# Patient Record
Sex: Female | Born: 1935 | Race: White | Hispanic: No | Smoking: Former smoker
Health system: Southern US, Community
[De-identification: ages and names within clinical notes are randomized; demographics above are authoritative.]

## PROBLEM LIST (undated history)

## (undated) DIAGNOSIS — J189 Pneumonia, unspecified organism: Secondary | ICD-10-CM

## (undated) DIAGNOSIS — I2510 Atherosclerotic heart disease of native coronary artery without angina pectoris: Secondary | ICD-10-CM

## (undated) DIAGNOSIS — C801 Malignant (primary) neoplasm, unspecified: Secondary | ICD-10-CM

## (undated) DIAGNOSIS — I4891 Unspecified atrial fibrillation: Secondary | ICD-10-CM

## (undated) DIAGNOSIS — I5189 Other ill-defined heart diseases: Secondary | ICD-10-CM

---

## 2003-07-28 ENCOUNTER — Emergency Department (HOSPITAL_COMMUNITY): Admission: EM | Admit: 2003-07-28 | Discharge: 2003-07-28 | Payer: Self-pay | Admitting: Emergency Medicine

## 2004-10-02 ENCOUNTER — Ambulatory Visit (HOSPITAL_COMMUNITY): Admission: RE | Admit: 2004-10-02 | Discharge: 2004-10-03 | Payer: Self-pay | Admitting: Neurological Surgery

## 2009-02-28 ENCOUNTER — Ambulatory Visit: Payer: Self-pay | Admitting: Cardiology

## 2009-02-28 ENCOUNTER — Inpatient Hospital Stay (HOSPITAL_COMMUNITY): Admission: EM | Admit: 2009-02-28 | Discharge: 2009-03-09 | Payer: Self-pay | Admitting: Emergency Medicine

## 2009-03-02 ENCOUNTER — Encounter (INDEPENDENT_AMBULATORY_CARE_PROVIDER_SITE_OTHER): Payer: Self-pay | Admitting: Internal Medicine

## 2009-04-10 ENCOUNTER — Ambulatory Visit (HOSPITAL_COMMUNITY): Admission: RE | Admit: 2009-04-10 | Discharge: 2009-04-10 | Payer: Self-pay | Admitting: Pulmonary Disease

## 2009-05-15 ENCOUNTER — Encounter (INDEPENDENT_AMBULATORY_CARE_PROVIDER_SITE_OTHER): Payer: Self-pay | Admitting: *Deleted

## 2009-06-06 ENCOUNTER — Ambulatory Visit: Payer: Self-pay | Admitting: Internal Medicine

## 2009-06-06 DIAGNOSIS — K6289 Other specified diseases of anus and rectum: Secondary | ICD-10-CM

## 2009-06-06 DIAGNOSIS — K921 Melena: Secondary | ICD-10-CM

## 2009-06-06 DIAGNOSIS — R198 Other specified symptoms and signs involving the digestive system and abdomen: Secondary | ICD-10-CM

## 2009-06-07 ENCOUNTER — Encounter: Payer: Self-pay | Admitting: Internal Medicine

## 2009-06-20 ENCOUNTER — Telehealth: Payer: Self-pay | Admitting: Gastroenterology

## 2009-06-20 ENCOUNTER — Telehealth (INDEPENDENT_AMBULATORY_CARE_PROVIDER_SITE_OTHER): Payer: Self-pay

## 2009-08-09 ENCOUNTER — Ambulatory Visit: Payer: Self-pay | Admitting: Internal Medicine

## 2009-08-17 ENCOUNTER — Ambulatory Visit (HOSPITAL_COMMUNITY): Admission: RE | Admit: 2009-08-17 | Discharge: 2009-08-17 | Payer: Self-pay | Admitting: Internal Medicine

## 2009-08-17 ENCOUNTER — Ambulatory Visit: Payer: Self-pay | Admitting: Internal Medicine

## 2009-08-21 ENCOUNTER — Encounter: Payer: Self-pay | Admitting: Internal Medicine

## 2010-01-23 ENCOUNTER — Ambulatory Visit (HOSPITAL_COMMUNITY): Admission: RE | Admit: 2010-01-23 | Discharge: 2010-01-23 | Payer: Self-pay | Admitting: Pulmonary Disease

## 2010-05-10 IMAGING — CT CT ANGIO CHEST
1 of 7 series · 4 of 36 positions shown · IV contrast (Omnipaque 300)
Comparison: Chest radiographs dated 02/28/2009.

CLINICAL DATA: Chest pain.  Cough.  Fever.  Elevated D-dimer.
Pneumonia.  Smoker.

CT ANGIOGRAPHY CHEST WITH CONTRAST
TECHNIQUE: Multidetector CT imaging of the chest was performed
using the standard protocol during bolus administration of
intravenous contrast. Multiplanar CT image reconstructions
including MIPs were obtained to evaluate the vascular anatomy.
Contrast: 80 ml [id number]-[id number]

[Series 6: pe 3.0 b40f · axial · 0.54mm/px · z∈[-238,-67]mm · 4 of 96 slices shown]
[im 20/96  lung]
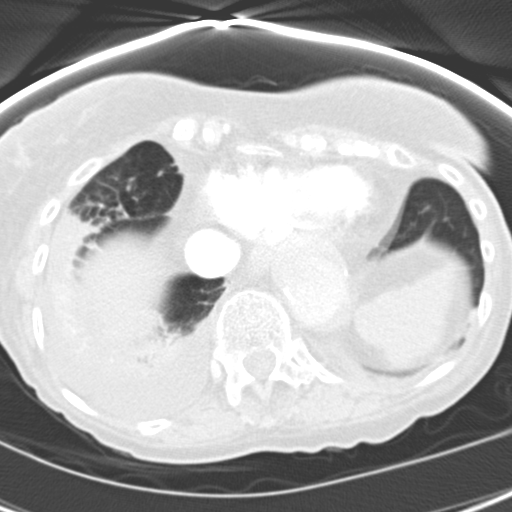
[im 39/96  mediastinal]
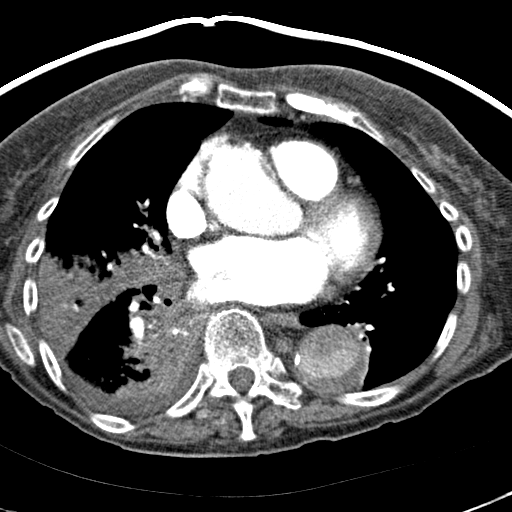
[im 58/96  lung]
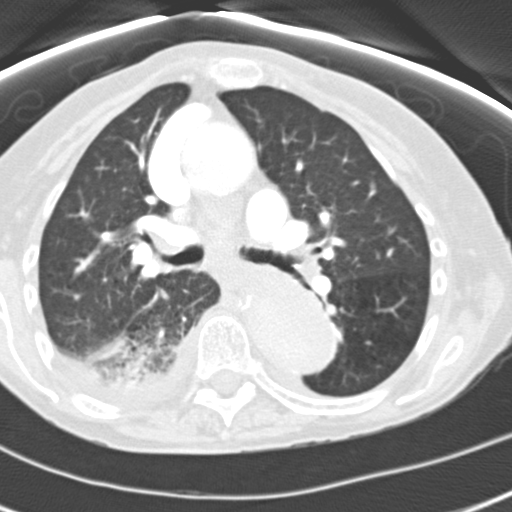
[im 77/96  mediastinal]
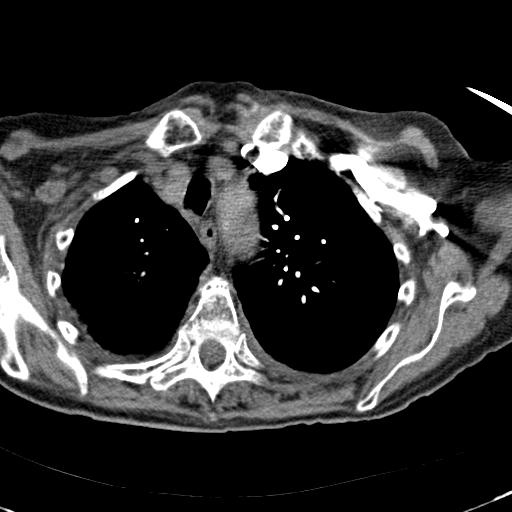

[4 of 36 positions shown; findings below may reference images not displayed]

FINDINGS: Normally opacified pulmonary arteries with no pulmonary
arterial filling defects seen.  Aneurysmal dilatation of the
ascending and descending thoracic aorta.  The ascending aorta
measures 5.2 cm in maximum diameter at the level of the main
pulmonary artery.  The proximal descending thoracic aorta measures
4.5 cm in maximum diameter and the distal descending thoracic aorta
measures 4.0 cm in maximum diameter.  Mural thrombus is also noted
in the distal descending thoracic aorta.  Patchy and dense airspace
consolidation is demonstrated in the right lower lobe and right
middle lobe.  There is also a small to moderate-sized right pleural
effusion.  Small left pleural effusion.  Mild changes of COPD and
chronic bronchitis are noted.  Fluid in the superior pericardial
recess with no lung masses or enlarged lymph nodes seen.
Unremarkable upper abdomen.  Mild thoracic spine degenerative
changes.

Review of the MIP images confirms the above findings.
IMPRESSION: 1.  No pulmonary emboli seen.
2.  Right middle lobe and right lower lobe pneumonia with an
associated small to moderate-sized right pleural effusion.
3.  Small left pleural effusion.
4.  Aneurysmal dilatation of the ascending and descending thoracic
aorta.

## 2010-05-13 IMAGING — CR DG CHEST 1V PORT
1 series · 1 of 1 positions shown · non-contrast
Comparison: CT 03/02/2009

CLINICAL DATA: Pneumonia

PORTABLE CHEST - 1 VIEW

[view not recorded]
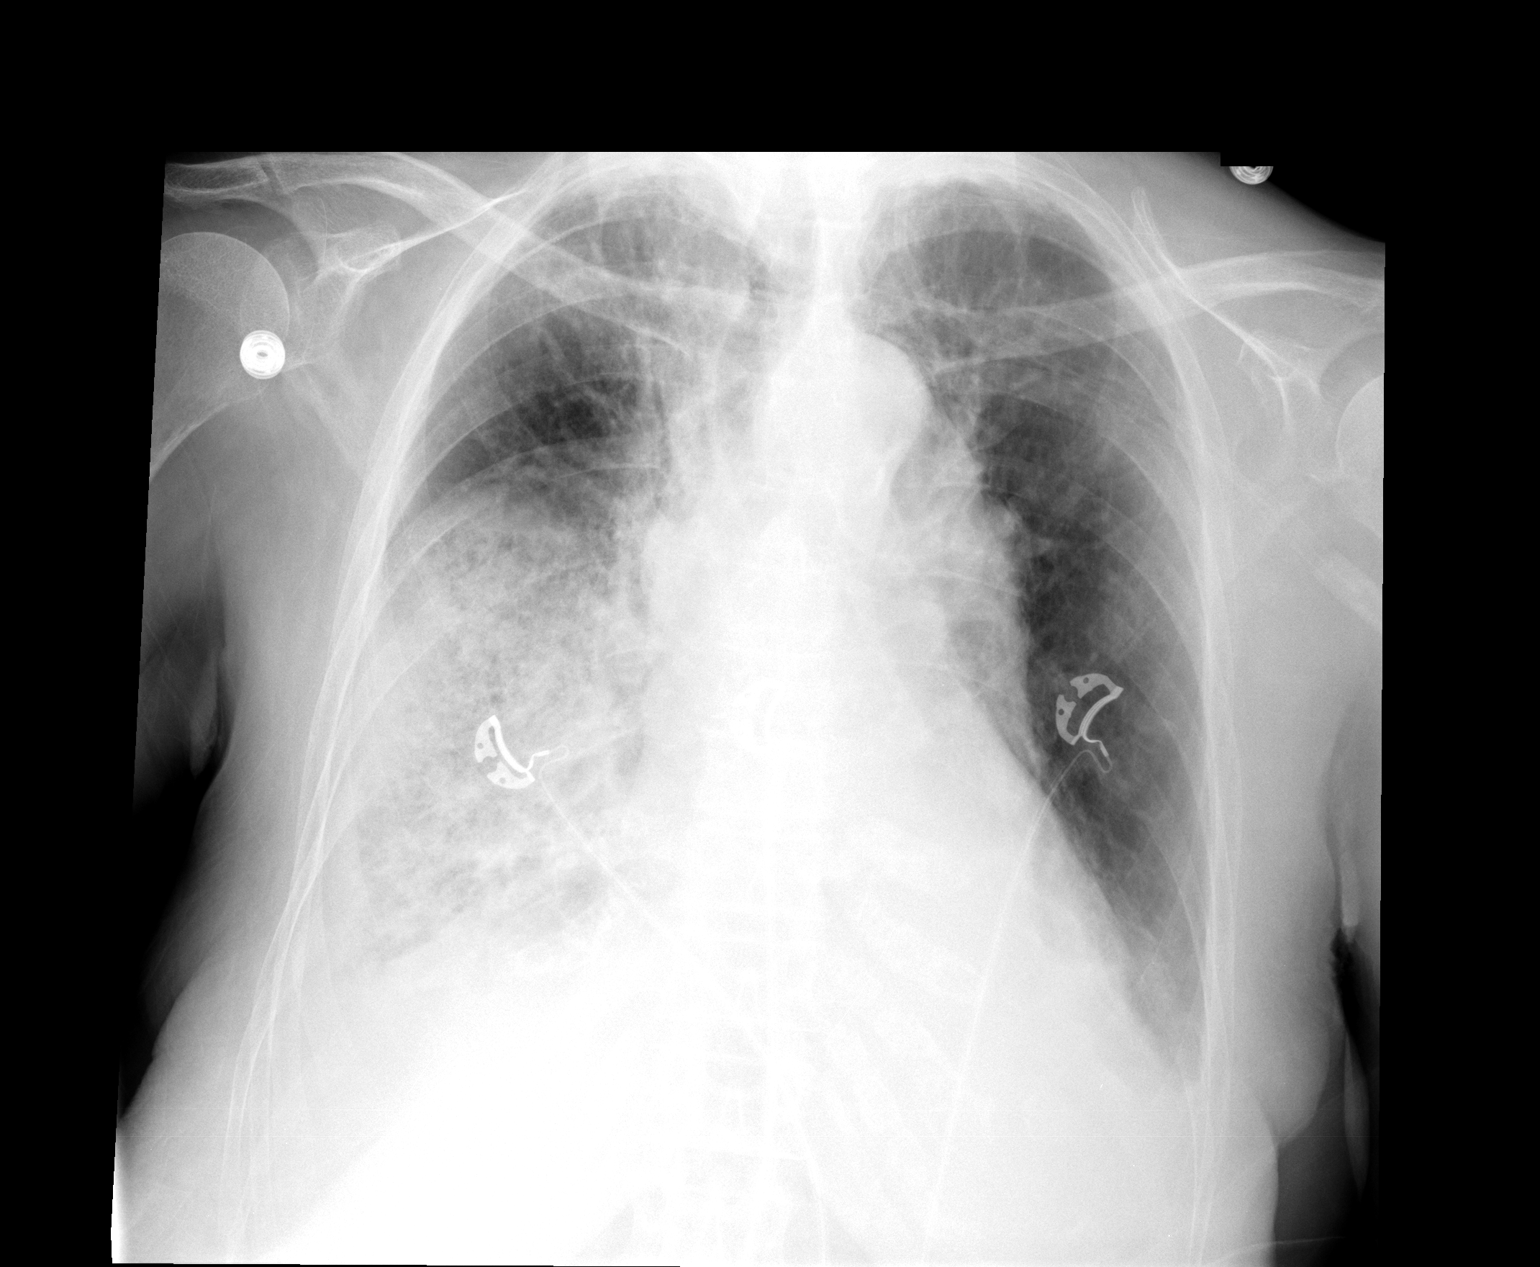

[1 of 1 positions shown; findings below may reference images not displayed]

FINDINGS: Dense airspace infiltrates in the right mid and lower
lung and in the left retrocardiac region.  Probable small pleural
effusions.  Tortuous ectatic thoracic aorta.  Heart size upper
limits normal.
IMPRESSION: 1.  Persistent asymmetric bibasilar airspace infiltrates, right
worse than left.

## 2010-05-14 IMAGING — US US ABDOMEN COMPLETE
1 series · 14 of 25 positions shown · non-contrast
Comparison: None

CLINICAL DATA: Right upper quadrant abdominal pain.  Chest pain.

COMPLETE ABDOMINAL ULTRASOUND

[Series 1: us abdomen complete · 0.20mm/px · 14 of 64 slices shown]
[im 1/64]
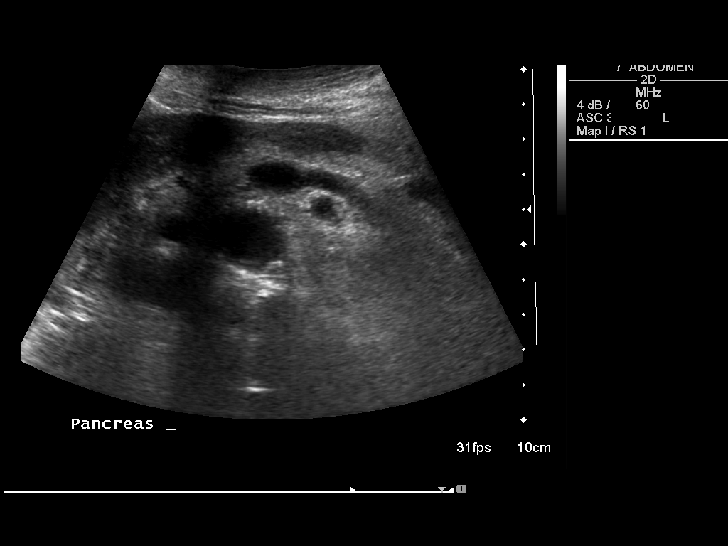
[im 6/64]
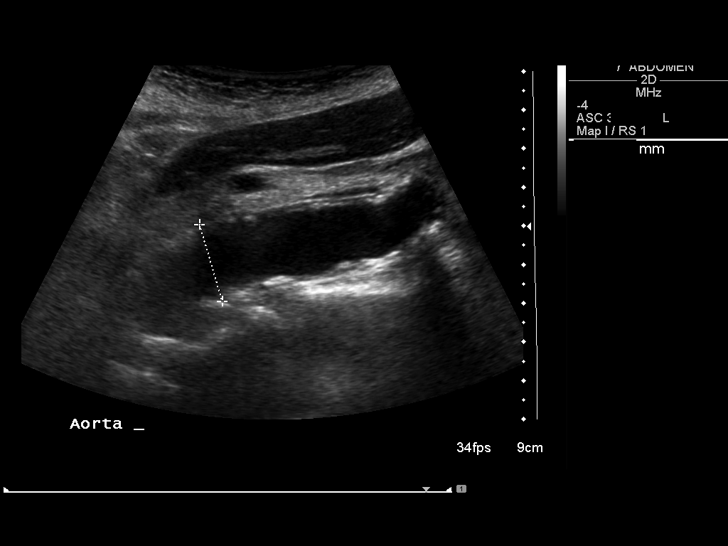
[im 11/64]
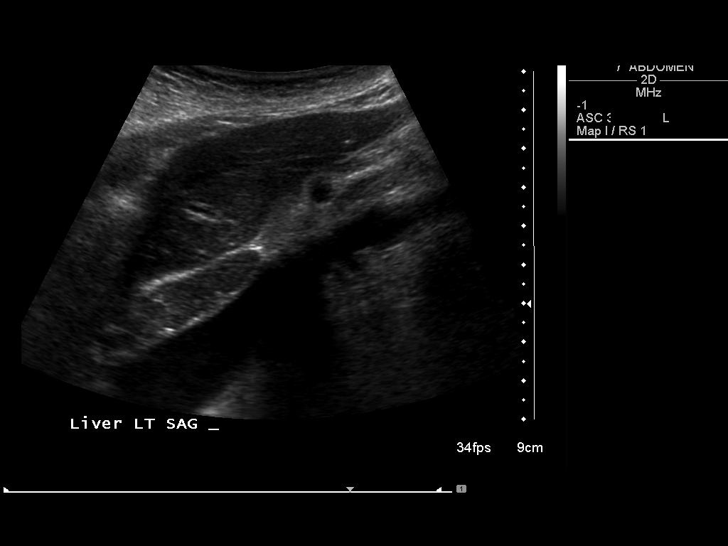
[im 16/64]
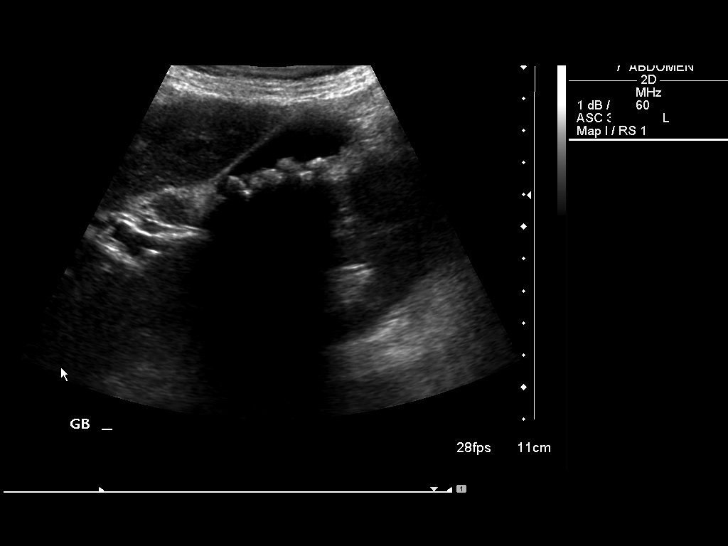
[im 22/64]
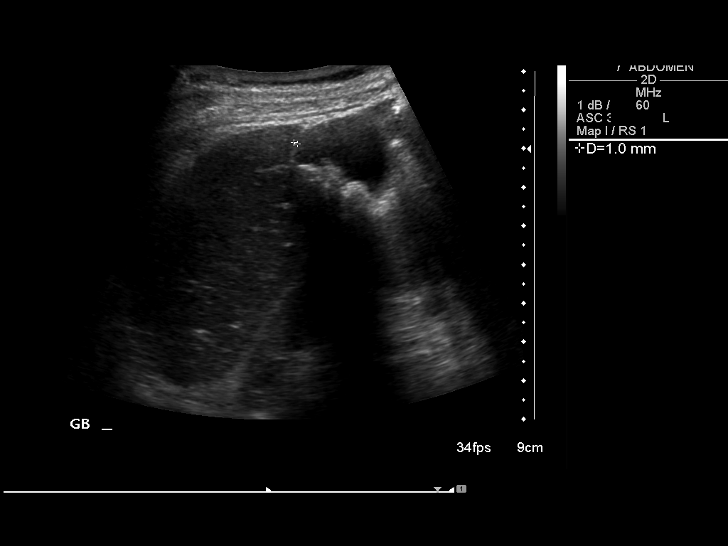
[im 24/64]
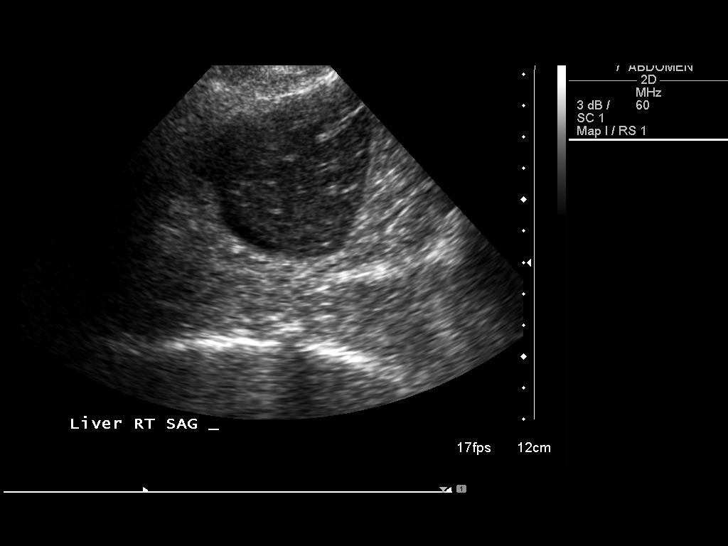
[im 29/64]
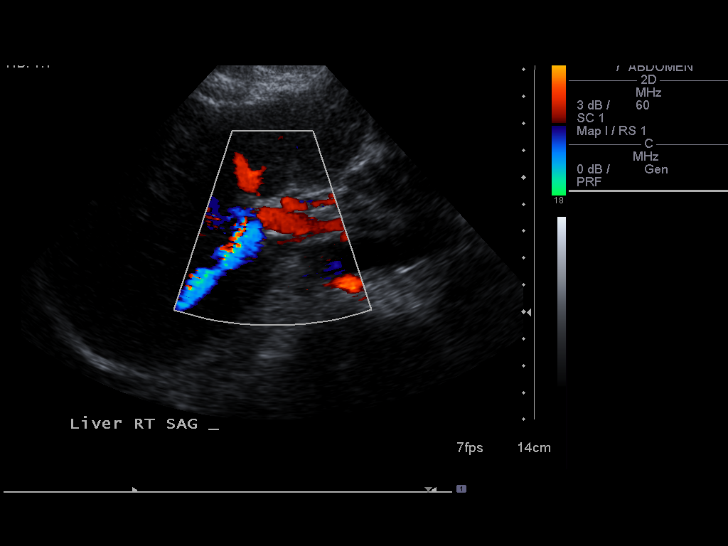
[im 35/64]
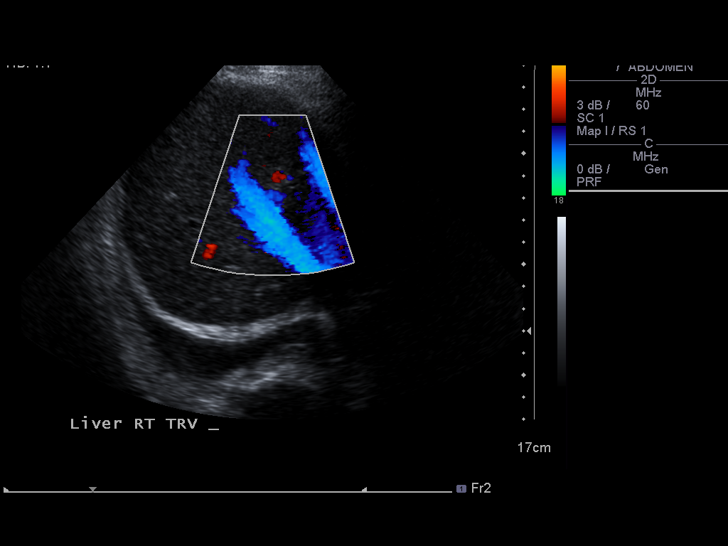
[im 40/64]
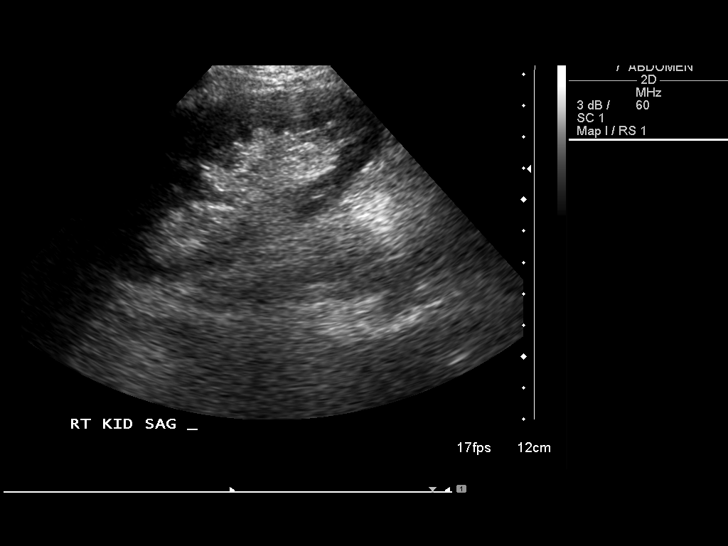
[im 43/64]
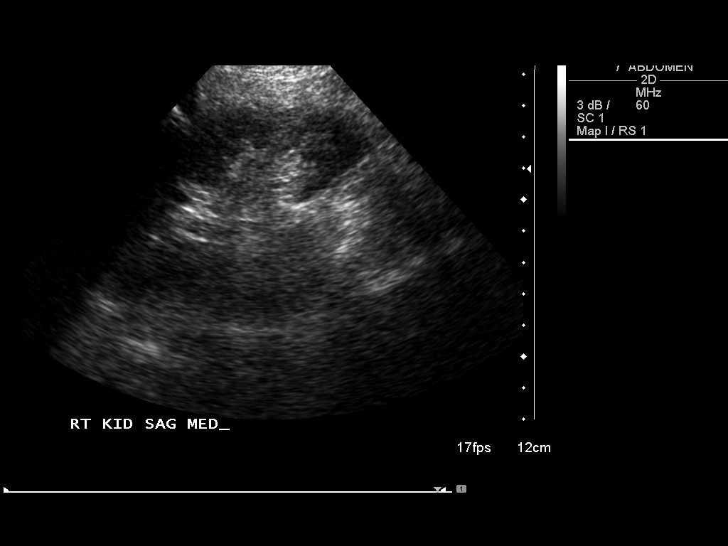
[im 48/64]
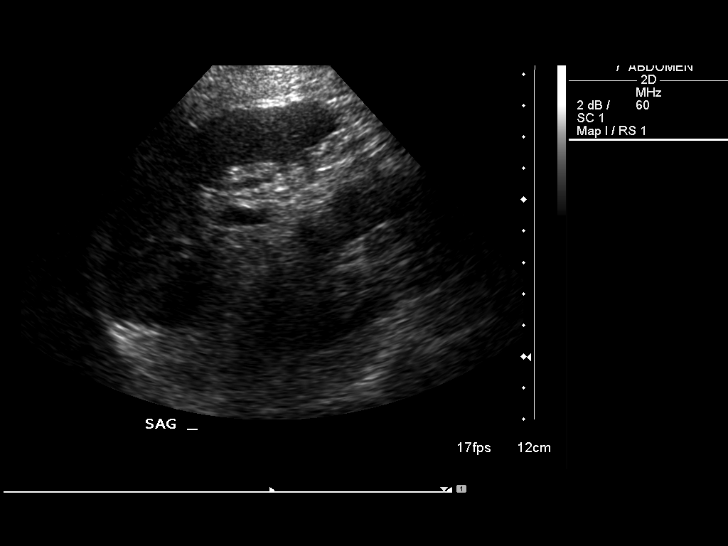
[im 53/64]
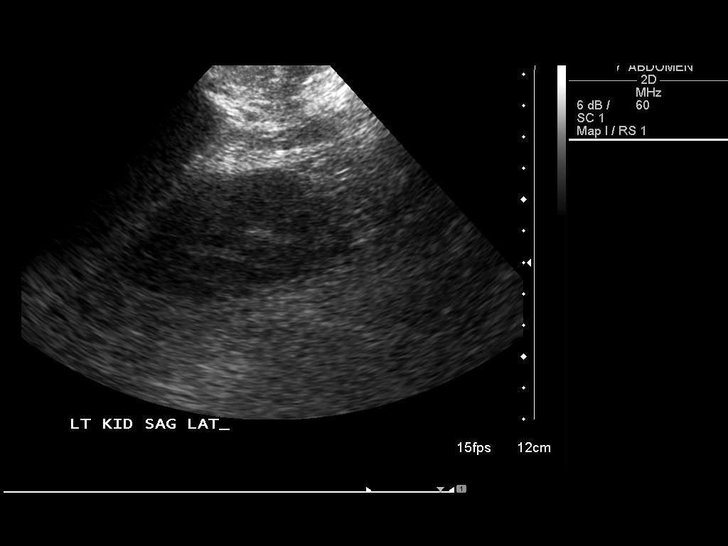
[im 58/64]
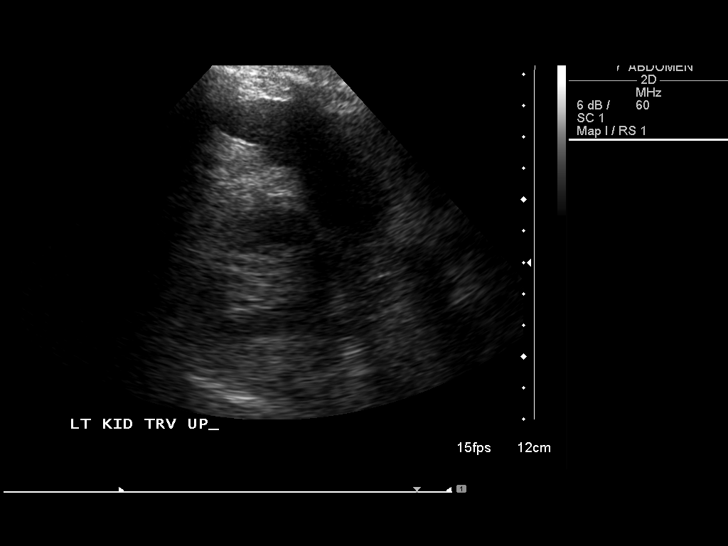
[im 64/64]
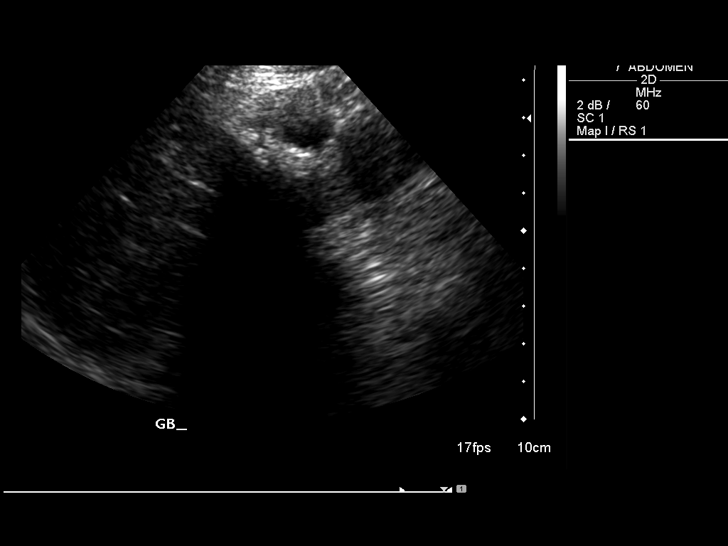

[14 of 25 positions shown; findings below may reference images not displayed]

FINDINGS: Gallbladder:  There are numerous stones in the gallbladder.  The
gallbladder is not distended and there is a negative sonographic
Murphy's sign.

Normal with maximal diameter of 2 mm.

Common bile duct:  Normal.

Liver:  Normal.

IVC:  Normal.

Pancreas:  Normal.

Spleen:  Normal.  6.6 cm in length.

Right Kidney:  Normal.  9.6 cm in length.

Left Kidney:  Normal.  10.0 cm in length.

Abdominal aorta:  Atherosclerotic irregularity.  Maximal diameter
of 2.1 cm.

There is a right pleural effusion.
IMPRESSION: Numerous gallstones.  Negative sonographic Murphy's sign.  Right
pleural effusion.  Chest x-ray demonstrates extensive pneumonia in
the right lung.  Could this account for the patient's pain?

## 2010-12-15 LAB — T4, FREE: Free T4: 1.16 ng/dL (ref 0.80–1.80)

## 2010-12-15 LAB — DIFFERENTIAL
Basophils Absolute: 0 10*3/uL (ref 0.0–0.1)
Basophils Absolute: 0 10*3/uL (ref 0.0–0.1)
Basophils Relative: 0 % (ref 0–1)
Basophils Relative: 1 % (ref 0–1)
Eosinophils Absolute: 0.1 10*3/uL (ref 0.0–0.7)
Eosinophils Absolute: 0.1 10*3/uL (ref 0.0–0.7)
Eosinophils Relative: 2 % (ref 0–5)
Eosinophils Relative: 2 % (ref 0–5)
Lymphocytes Relative: 16 % (ref 12–46)
Lymphocytes Relative: 17 % (ref 12–46)
Lymphs Abs: 1.3 10*3/uL (ref 0.7–4.0)
Lymphs Abs: 1.5 10*3/uL (ref 0.7–4.0)
Monocytes Absolute: 0.9 10*3/uL (ref 0.1–1.0)
Monocytes Absolute: 1.1 10*3/uL — ABNORMAL HIGH (ref 0.1–1.0)
Monocytes Relative: 11 % (ref 3–12)
Monocytes Relative: 12 % (ref 3–12)
Neutro Abs: 5.9 10*3/uL (ref 1.7–7.7)
Neutro Abs: 6.3 10*3/uL (ref 1.7–7.7)
Neutrophils Relative %: 70 % (ref 43–77)
Neutrophils Relative %: 71 % (ref 43–77)

## 2010-12-15 LAB — COMPREHENSIVE METABOLIC PANEL
ALT: 30 U/L (ref 0–35)
AST: 26 U/L (ref 0–37)
Albumin: 2.4 g/dL — ABNORMAL LOW (ref 3.5–5.2)
Alkaline Phosphatase: 65 U/L (ref 39–117)
BUN: 3 mg/dL — ABNORMAL LOW (ref 6–23)
CO2: 33 mEq/L — ABNORMAL HIGH (ref 19–32)
Calcium: 8.5 mg/dL (ref 8.4–10.5)
Chloride: 101 mEq/L (ref 96–112)
Creatinine, Ser: 0.49 mg/dL (ref 0.4–1.2)
GFR calc Af Amer: >60 mL/min (ref >60)
GFR calc non Af Amer: >60 mL/min (ref >60)
Glucose, Bld: 107 mg/dL — ABNORMAL HIGH (ref 70–99)
Potassium: 3.6 mEq/L (ref 3.5–5.1)
Sodium: 140 mEq/L (ref 135–145)
Total Bilirubin: 0.5 mg/dL (ref 0.3–1.2)
Total Protein: 5.3 g/dL — ABNORMAL LOW (ref 6.0–8.3)

## 2010-12-15 LAB — HEPARIN LEVEL (UNFRACTIONATED): Heparin Unfractionated: 0.81 IU/mL — ABNORMAL HIGH (ref 0.30–0.70)

## 2010-12-15 LAB — CBC
HCT: 34.1 % — ABNORMAL LOW (ref 36.0–46.0)
HCT: 34.4 % — ABNORMAL LOW (ref 36.0–46.0)
Hemoglobin: 11.7 g/dL — ABNORMAL LOW (ref 12.0–15.0)
Hemoglobin: 11.9 g/dL — ABNORMAL LOW (ref 12.0–15.0)
MCHC: 34.1 g/dL (ref 30.0–36.0)
MCHC: 35 g/dL (ref 30.0–36.0)
MCV: 96.6 fL (ref 78.0–100.0)
MCV: 97.9 fL (ref 78.0–100.0)
Platelets: 309 10*3/uL (ref 150–400)
Platelets: 352 10*3/uL (ref 150–400)
RBC: 3.51 MIL/uL — ABNORMAL LOW (ref 3.87–5.11)
RBC: 3.53 MIL/uL — ABNORMAL LOW (ref 3.87–5.11)
RDW: 13.7 % (ref 11.5–15.5)
RDW: 13.7 % (ref 11.5–15.5)
WBC: 8.4 10*3/uL (ref 4.0–10.5)
WBC: 9 10*3/uL (ref 4.0–10.5)

## 2010-12-15 LAB — BASIC METABOLIC PANEL
BUN: 3 mg/dL — ABNORMAL LOW (ref 6–23)
CO2: 32 mEq/L (ref 19–32)
Calcium: 8.6 mg/dL (ref 8.4–10.5)
Chloride: 100 mEq/L (ref 96–112)
Creatinine, Ser: 0.42 mg/dL (ref 0.4–1.2)
GFR calc Af Amer: >60 mL/min (ref >60)
GFR calc non Af Amer: >60 mL/min (ref >60)
Glucose, Bld: 112 mg/dL — ABNORMAL HIGH (ref 70–99)
Potassium: 3.4 mEq/L — ABNORMAL LOW (ref 3.5–5.1)
Sodium: 138 mEq/L (ref 135–145)

## 2010-12-15 LAB — HEMOCCULT GUIAC POC 1CARD (OFFICE): Fecal Occult Bld: NEGATIVE

## 2010-12-15 LAB — PROTIME-INR
INR: 1.2 (ref 0.00–1.49)
INR: 1.7 — ABNORMAL HIGH (ref 0.00–1.49)
Prothrombin Time: 15.3 seconds — ABNORMAL HIGH (ref 11.6–15.2)
Prothrombin Time: 21 seconds — ABNORMAL HIGH (ref 11.6–15.2)

## 2010-12-16 LAB — COMPREHENSIVE METABOLIC PANEL
ALT: 12 U/L (ref 0–35)
ALT: 41 U/L — ABNORMAL HIGH (ref 0–35)
AST: 22 U/L (ref 0–37)
AST: 40 U/L — ABNORMAL HIGH (ref 0–37)
AST: 59 U/L — ABNORMAL HIGH (ref 0–37)
Albumin: 2.1 g/dL — ABNORMAL LOW (ref 3.5–5.2)
Alkaline Phosphatase: 64 U/L (ref 39–117)
Alkaline Phosphatase: 66 U/L (ref 39–117)
BUN: 7 mg/dL (ref 6–23)
CO2: 28 mEq/L (ref 19–32)
CO2: 31 mEq/L (ref 19–32)
Calcium: 9.4 mg/dL (ref 8.4–10.5)
Chloride: 101 mEq/L (ref 96–112)
Chloride: 98 mEq/L (ref 96–112)
Creatinine, Ser: 0.54 mg/dL (ref 0.4–1.2)
GFR calc Af Amer: 57 mL/min — ABNORMAL LOW (ref >60)
GFR calc Af Amer: >60 mL/min (ref >60)
GFR calc non Af Amer: >60 mL/min (ref >60)
GFR calc non Af Amer: >60 mL/min (ref >60)
Glucose, Bld: 105 mg/dL — ABNORMAL HIGH (ref 70–99)
Glucose, Bld: 92 mg/dL (ref 70–99)
Potassium: 3.5 mEq/L (ref 3.5–5.1)
Potassium: 3.7 mEq/L (ref 3.5–5.1)
Sodium: 131 mEq/L — ABNORMAL LOW (ref 135–145)
Sodium: 135 mEq/L (ref 135–145)
Total Bilirubin: 0.2 mg/dL — ABNORMAL LOW (ref 0.3–1.2)
Total Bilirubin: 0.3 mg/dL (ref 0.3–1.2)
Total Protein: 6.8 g/dL (ref 6.0–8.3)

## 2010-12-16 LAB — DIFFERENTIAL
Basophils Absolute: 0 10*3/uL (ref 0.0–0.1)
Basophils Absolute: 0 10*3/uL (ref 0.0–0.1)
Basophils Absolute: 0 10*3/uL (ref 0.0–0.1)
Basophils Absolute: 0 10*3/uL (ref 0.0–0.1)
Basophils Absolute: 0 10*3/uL (ref 0.0–0.1)
Basophils Relative: 0 % (ref 0–1)
Basophils Relative: 0 % (ref 0–1)
Basophils Relative: 0 % (ref 0–1)
Basophils Relative: 0 % (ref 0–1)
Basophils Relative: 0 % (ref 0–1)
Eosinophils Absolute: 0 10*3/uL (ref 0.0–0.7)
Eosinophils Absolute: 0 10*3/uL (ref 0.0–0.7)
Eosinophils Absolute: 0 10*3/uL (ref 0.0–0.7)
Eosinophils Absolute: 0.1 10*3/uL (ref 0.0–0.7)
Eosinophils Relative: 0 % (ref 0–5)
Eosinophils Relative: 0 % (ref 0–5)
Eosinophils Relative: 0 % (ref 0–5)
Eosinophils Relative: 1 % (ref 0–5)
Lymphocytes Relative: 13 % (ref 12–46)
Lymphocytes Relative: 9 % — ABNORMAL LOW (ref 12–46)
Lymphs Abs: 0.7 10*3/uL (ref 0.7–4.0)
Lymphs Abs: 0.9 10*3/uL (ref 0.7–4.0)
Lymphs Abs: 1.2 10*3/uL (ref 0.7–4.0)
Monocytes Absolute: 1 10*3/uL (ref 0.1–1.0)
Monocytes Absolute: 1.1 10*3/uL — ABNORMAL HIGH (ref 0.1–1.0)
Monocytes Absolute: 1.3 10*3/uL — ABNORMAL HIGH (ref 0.1–1.0)
Monocytes Absolute: 1.3 10*3/uL — ABNORMAL HIGH (ref 0.1–1.0)
Monocytes Relative: 11 % (ref 3–12)
Monocytes Relative: 12 % (ref 3–12)
Monocytes Relative: 16 % — ABNORMAL HIGH (ref 3–12)
Monocytes Relative: 7 % (ref 3–12)
Neutro Abs: 5.3 10*3/uL (ref 1.7–7.7)
Neutro Abs: 5.3 10*3/uL (ref 1.7–7.7)
Neutro Abs: 6.6 10*3/uL (ref 1.7–7.7)
Neutro Abs: 9.5 10*3/uL — ABNORMAL HIGH (ref 1.7–7.7)
Neutrophils Relative %: 69 % (ref 43–77)
Neutrophils Relative %: 73 % (ref 43–77)
Neutrophils Relative %: 83 % — ABNORMAL HIGH (ref 43–77)
Neutrophils Relative %: 86 % — ABNORMAL HIGH (ref 43–77)

## 2010-12-16 LAB — BASIC METABOLIC PANEL
BUN: 10 mg/dL (ref 6–23)
BUN: 9 mg/dL (ref 6–23)
CO2: 26 mEq/L (ref 19–32)
CO2: 28 mEq/L (ref 19–32)
CO2: 30 mEq/L (ref 19–32)
Calcium: 8.2 mg/dL — ABNORMAL LOW (ref 8.4–10.5)
Calcium: 8.3 mg/dL — ABNORMAL LOW (ref 8.4–10.5)
Chloride: 100 mEq/L (ref 96–112)
Chloride: 95 mEq/L — ABNORMAL LOW (ref 96–112)
Creatinine, Ser: 0.58 mg/dL (ref 0.4–1.2)
Creatinine, Ser: 0.79 mg/dL (ref 0.4–1.2)
GFR calc Af Amer: >60 mL/min (ref >60)
GFR calc Af Amer: >60 mL/min (ref >60)
GFR calc non Af Amer: >60 mL/min (ref >60)
Glucose, Bld: 114 mg/dL — ABNORMAL HIGH (ref 70–99)
Glucose, Bld: 88 mg/dL (ref 70–99)
Potassium: 3.3 mEq/L — ABNORMAL LOW (ref 3.5–5.1)
Potassium: 3.7 mEq/L (ref 3.5–5.1)
Potassium: 4 mEq/L (ref 3.5–5.1)
Sodium: 129 mEq/L — ABNORMAL LOW (ref 135–145)
Sodium: 130 mEq/L — ABNORMAL LOW (ref 135–145)

## 2010-12-16 LAB — CBC
HCT: 33.2 % — ABNORMAL LOW (ref 36.0–46.0)
HCT: 34.4 % — ABNORMAL LOW (ref 36.0–46.0)
HCT: 37.2 % (ref 36.0–46.0)
Hemoglobin: 11.5 g/dL — ABNORMAL LOW (ref 12.0–15.0)
Hemoglobin: 11.7 g/dL — ABNORMAL LOW (ref 12.0–15.0)
Hemoglobin: 12.2 g/dL (ref 12.0–15.0)
Hemoglobin: 12.9 g/dL (ref 12.0–15.0)
MCHC: 34.3 g/dL (ref 30.0–36.0)
MCHC: 34.5 g/dL (ref 30.0–36.0)
MCHC: 34.6 g/dL (ref 30.0–36.0)
MCHC: 34.7 g/dL (ref 30.0–36.0)
MCHC: 34.7 g/dL (ref 30.0–36.0)
MCHC: 34.8 g/dL (ref 30.0–36.0)
MCV: 96.7 fL (ref 78.0–100.0)
MCV: 97.8 fL (ref 78.0–100.0)
Platelets: 151 10*3/uL (ref 150–400)
Platelets: 169 10*3/uL (ref 150–400)
Platelets: 183 10*3/uL (ref 150–400)
RBC: 3.41 MIL/uL — ABNORMAL LOW (ref 3.87–5.11)
RBC: 3.51 MIL/uL — ABNORMAL LOW (ref 3.87–5.11)
RBC: 3.6 MIL/uL — ABNORMAL LOW (ref 3.87–5.11)
RBC: 3.65 MIL/uL — ABNORMAL LOW (ref 3.87–5.11)
RDW: 13.8 % (ref 11.5–15.5)
RDW: 13.9 % (ref 11.5–15.5)
RDW: 14.1 % (ref 11.5–15.5)
RDW: 14.1 % (ref 11.5–15.5)
WBC: 11.5 10*3/uL — ABNORMAL HIGH (ref 4.0–10.5)
WBC: 6.9 10*3/uL (ref 4.0–10.5)
WBC: 7.2 10*3/uL (ref 4.0–10.5)
WBC: 8.3 10*3/uL (ref 4.0–10.5)

## 2010-12-16 LAB — CULTURE, RESPIRATORY W GRAM STAIN

## 2010-12-16 LAB — TSH: TSH: 1.013 u[IU]/mL (ref 0.350–4.500)

## 2010-12-16 LAB — URINALYSIS, ROUTINE W REFLEX MICROSCOPIC
Glucose, UA: NEGATIVE mg/dL
pH: 6 (ref 5.0–8.0)

## 2010-12-16 LAB — CARDIAC PANEL(CRET KIN+CKTOT+MB+TROPI)
CK, MB: 1.6 ng/mL (ref 0.3–4.0)
Relative Index: INVALID (ref 0.0–2.5)
Relative Index: INVALID (ref 0.0–2.5)
Troponin I: 0.07 ng/mL — ABNORMAL HIGH (ref 0.00–0.06)

## 2010-12-16 LAB — CLOSTRIDIUM DIFFICILE EIA: C difficile Toxins A+B, EIA: NEGATIVE

## 2010-12-16 LAB — URINE CULTURE
Colony Count: NO GROWTH
Culture: NO GROWTH

## 2010-12-16 LAB — BRAIN NATRIURETIC PEPTIDE: Pro B Natriuretic peptide (BNP): 753 pg/mL — ABNORMAL HIGH (ref 0.0–100.0)

## 2010-12-16 LAB — LIPID PANEL
Cholesterol: 140 mg/dL (ref 0–200)
HDL: 66 mg/dL (ref >39)

## 2010-12-16 LAB — PROTIME-INR: Prothrombin Time: 15.3 seconds — ABNORMAL HIGH (ref 11.6–15.2)

## 2010-12-16 LAB — HEMOCCULT GUIAC POC 1CARD (OFFICE): Fecal Occult Bld: POSITIVE

## 2010-12-16 LAB — MAGNESIUM: Magnesium: 1.7 mg/dL (ref 1.5–2.5)

## 2011-01-21 NOTE — Consult Note (Signed)
Brianna Parker, CORREDOR               ACCOUNT NO.:  [id number]   MEDICAL RECORD NO.:  [id number]          PATIENT TYPE:  INP   LOCATION:  A317                          FACILITY:  [facility]   PHYSICIAN:  Edward L. Juanetta Gosling, M.D.DATE OF BIRTH:  06/01/1922   DATE OF CONSULTATION:  DATE OF DISCHARGE:                                 CONSULTATION   [name] is a patient of hospitalist who has been admitted on the  23rd with pneumonia.  She has also had some abdominal pain.  She is  blind and she is very hard of hearing.  She has been in multifocal  atrial tachycardia and has had consultation with Cardiology team.  She  says she is feeling better.  She is still coughing some, but she is not  coughing as much.  She says that she has been sick for 2 or 3 weeks.   Her past medical history is positive for arthritis, hypertension, back  pain, hearing loss, back surgery for spinal stenosis, glaucoma, mild  dementia.   SOCIAL HISTORY:  She does live alone.  She uses a cane.  She still  smokes about 2 packets of cigarettes daily.  She does not use any  alcohol, does not use any illicit drug.  She has multiple family members  in the area who help take care of her.   Her family history is positive for coronary disease, stroke, and high  blood pressure.   REVIEW OF SYSTEMS:  She said that she has still got some nausea and some  pain in the right side of her chest.   Physical examination shows a thin female who is definitely hard of  hearing and seems to have significant visual impairment as well.  She is  currently afebrile but has had a temperature, on admission, as high as  102.  Temperature now 98.6.  She continues to have tachycardia and  irregular heart rate and her pulse is 130 and irregular, respirations  are 18, blood pressure 110/80, O2 sat is 96% on 3 L.  Her mucous  membranes are moist.  Her neck is supple without masses.  Her chest is  clear without wheezes, rales, or rhonchi on the left.   Her right shows  more rhonchi.  Her heart is irregular with tachycardia.  Her abdomen is  mildly tender diffusely.  Extremities showed no edema.  CNS grossly  intact.   LABORATORY WORK:  Today, CBC shows white count 6900, hemoglobin is 11.7,  platelets 219, amylase is 52.  Comp metabolic profile shows BUN of 6,  creatinine 0.59, SGOT is 40, SGPT is 41, albumin is 2.1.  Her BNP was  753.  Chest x-ray done yesterday shows continued infiltrates, right mid  and lower lung and in the left retrocardiac area, so bilateral.   ASSESSMENT:  Clinically, after discussion with Dr. Ladona Ridgel, the  hospitalist attending, she feels that she is improving, but her chest x-  ray still shows pneumonia.  She still has multifocal atrial tachycardia.  She is currently on Rocephin, Zithromax.  We will plan to continue with  her  medications because she is clinically improving.  I do not think we  need to change her antibiotics at this point.      Edward L. Juanetta Gosling, M.D.  Electronically Signed     ELH/MEDQ  D:  03/06/2009  T:  03/07/2009  Job:  [id number]

## 2011-01-21 NOTE — Group Therapy Note (Signed)
NAME:  Brianna Parker, Brianna Parker               ACCOUNT NO.:  [id number]   MEDICAL RECORD NO.:  [id number]          PATIENT TYPE:  INP   LOCATION:  A317                          FACILITY:  [facility]   PHYSICIAN:  Melissa L. Ladona Ridgel, MD  DATE OF BIRTH:  1921/12/22   DATE OF PROCEDURE:  03/04/2009  DATE OF DISCHARGE:                                 PROGRESS NOTE   Subjectively the patient is doing better but complains today of  diarrhea.  She complains of abdominal pain but this is related to the  injection sites for Lovenox, not a deep type abdominal pain.  Her  appetite has been decreased secondary to not feeling well but otherwise  her affect is appropriate.  She remains hard of hearing.  Today her  temperature is 97.2 with a T-max of 100.5, blood pressure is 120/71,  pulse 119 to as high as 140s.  Systolic blood pressure is 120,  saturation 93-95%.  Intake and output totals reveal 4705 in,  1100 out  for a positive total of 3605.  She had two stools documented yesterday.   EXAMINATION:  GENERAL:  This is a pleasant but headstrong white female  in no acute distress.  She is normocephalic, atraumatic.  Pupils equal,  round and reactive to light.  Extraocular muscles intact.  She has  anicteric sclerae.  Examination of nose revealed no discharge.  Examination of the mouth reveals no oral lesions or lip lesions.  NECK:  Supple.  There is no JVD, no lymph nodes, no carotid bruits.  CHEST:  Has a very wet, coarse cough which she is able to produce a  little bit according to her. Otherwise she remains in no respiratory  distress.  CARDIOVASCULAR:  Regular but intermittently tachycardic.  No heave or  thrills noted.  No murmurs or gallops.  ABDOMEN:  Soft, nontender, nondistended with positive bowel sounds.  EXTREMITIES:  Show no clubbing, cyanosis or edema.  NEUROLOGIC:  The cranial nerves are intact with the exception of her  hearing which is very poor.  You have to yell quite loud.   PERTINENT  LABORATORIES:  Today reveal a sodium of 131, potassium 4.0,  chloride 100, CO2 is 26, BUN is 9, creatinine 0.58.  Her glucose was  114.  White count is 7.2 with a hemoglobin of 11.9, hematocrit 34.4,  platelets of 169.  Urine cultures have been negative.  Again TSH is  1.013.   ASSESSMENT/PLAN:  This is an 77 year old white female who presented with  shortness of breath with generalized weakness found to have right-sided  pneumonia.  The patient's hospital course has progressed to loosening of  the cough, improved cognitive status but the onset of diarrhea today.  1. Pneumonia.  Continue antibiotic therapy.  Decrease her IV fluids to      keep vein open and check a chest x-ray in the morning.  Please note      that the patient had had a negative CT for pulmonary embolism on      the 25th.  She is day #4 of  IV antibiotics.  We will  obtain a      sputum culture now that she has a loose cough.  2. Diarrhea.  We will check her for Clostridium difficile.  I will      stop her Senna and we will heme check her stools.  3. Urinary retention.  I am going to attempt to discontinue her Foley      catheter today and if she is unable to void or has a positive post      void residual, we will need to obtain a urological consult.  4. Hyponatremia is improving.  We will continue to monitor that.  5. Potassium and today it is back to normal.  6. Multifocal atrial tachycardia.  The patient has been persistently      in the 130s-140s.  I am going to go ahead and up titrate her      Cardizem to 60 mg of Cardizem every 8 hours.  7. Depression and anxiety.  We will continue her Tranxene.  8. Glaucoma.  She is on Timoptic drops which we will continue.  9. Tobacco abuse.  The nicotine patch we will maintain.  10.The patient wanted to do to physical therapy again in the morning      and our ultimate goal is probably skilled nursing placement if we      can convince the patient to cooperate.  Total time on this  case is      30 minutes.      Melissa L. Ladona Ridgel, MD  Electronically Signed     MLT/MEDQ  D:  03/04/2009  T:  03/04/2009  Job:  [id number]

## 2011-01-21 NOTE — Group Therapy Note (Signed)
NAME:  Brianna Parker, Brianna Parker               ACCOUNT NO.:  [id number]   MEDICAL RECORD NO.:  [id number]          PATIENT TYPE:  INP   LOCATION:  A317                          FACILITY:  [facility]   PHYSICIAN:  Melissa L. Ladona Ridgel, MD  DATE OF BIRTH:  03-27-1922   DATE OF PROCEDURE:  03/07/2009  DATE OF DISCHARGE:                                 PROGRESS NOTE   SUBJECTIVE:  The patient states she is eating better.  She tells me a  very long story about how she participated in physical therapy today and  was very brave and sat up in her chair for greater than an hour which  she felt was a little bit too long.  There were issues surrounding that  she felt she was not positioned comfortably but in general it sounds as  if she is starting to participate a little bit in therapy but needs  quite a bit of encouragement.  She tells me about her chest pain last  night and how it was relieved by the medicine under her tongue.  She  denied any nausea, vomiting and states that she wants to eat a little  bit more which is new for her.  The patient currently denies chest pain,  shortness of breath, abdominal pain, nausea, vomiting.   OBJECTIVE:  VITAL SIGNS:  T-max looks like 99.0 which means that she has  essentially been defervesced for about 48 hours.  She is still a little  bit short of breath and has some coarse crackles.  Her current  temperature is 97.6.  Blood pressure ranging from 93/55 to 123/68.  Pulse is running more consistently in the 80s to 108.  Saturation 98% on  2 liters.  Intake and output, it appears that she has had two stools  yesterday and one today.  The nurse did observe some looseness to that  so I will recheck it for blood.  GENERAL:  This is a frail elderly female in no acute distress.  HEENT:  She is normocephalic, atraumatic.  Pupils equal, round and  reactive to light.  Extraocular muscles intact.  Mucous membranes are  moist.  NECK:  Supple.  I did not appreciate any JVD.  CHEST:   Has coarse crackles at the right base.  No wheezing.  She has  decreased both the right base and left base and she is dull to  percussion on the left side.  CARDIOVASCULAR:  Irregular.  Positive S1, S2.  No S3, S4.  No murmurs,  rubs or gallops.  ABDOMEN:  Abdomen is soft, nontender, nondistended.  Positive bowel  sounds.  There is no hepatosplenomegaly, no guarding, no rebound.  EXTREMITIES:  She has no peripheral edema.  She has 2+ pulses.  Her  strength in the upper is about 4+ symmetrically.  Plantars are  downgoing.  NEUROLOGICAL:  She reorients herself very distinctly to time, place and  person.  She is very set in her ways and making her decisions and is  determined to go home.  She talks about getting help at home even though  she has a  very dedicated family.  PSYCHIATRIC:  Affect is appropriate.  Recent and remote memory intact.  She is more animated than she has been in the hospital stay.   Review of the telemetry reveals atrial fib, rates as high as 140.  Results of her echo are noted on June 25 again to show EF of 60-65% with  an abnormal relaxation pattern, mild tricuspid regurgitation and  dilatation of the inferior vena cava suggesting elevated central  pressures.  The patient also has mild regurgitation in aortic valve.   There are no laboratories today.  I will check a CBC and BMET tomorrow  to assure that she continues to maintain her hemoglobin even though she  is anemic.   ASSESSMENT AND PLAN:  The patient is an 77 year old white female with  admission for right lower lobe pneumonia.  Course is complicated by  decreased appetite, abdominal discomfort which does not appear to be  related to her gallbladder or her liver and was confirmed with  ultrasound.  She does have a slight hemoccult positive stool which we  are following and she also has a little bit of loose stool, whether this  is related to the heme positive stool or to the antibiotics I am going  to add  Flora-Q to see if that helps and will recheck her Hemoccult and  if they remain positive we may have to stop her anticoagulation.  Please  note that the patient is on full Lovenox dosing because of known atrial  fibrillation.  The patient has been hyponatremic over the course of the  hospital stay.  Her IV fluids were decreased and therefore taking away  some of her free water has improved that situation.  The patient's  course was then complicated by chest pain in the face of this atrial  fibrillation.  Dr. Dietrich Pates continued to see her and assist in titrating  medication.  Will determine whether or not we need to move forward with  any sort of testing before she discharges to home or whether medical  therapy is appropriate.  1. Pneumonia, slowly improving.  Dr. Juanetta Gosling has seen her and is      supporting the continued doses of azithromycin and ceftriaxone      which have been in place since admission.  She is currently day 7      of antibiotics.  I would suggest at least a day 10 and we may need      to reimage her to look at the pleural effusion on the right which      was described as small but present.  2. Hemoccult positive stool.  At this time the patient remains on      Lovenox because of her atrial fibrillation.  If she continues to      have loose stool or a decrease in hemoglobin that will need to be      discontinued.  3. Atrial fibrillation.  Dr. Dietrich Pates has added digoxin, uptitrated      her Cardizem.  Will continue to monitor her.  Her rate seemed to be      a little bit better today.  She does still feel like she is in      atrial fibrillation, however.  4. Diastolic dysfunction with elevation in her BMP.  At this time Dr.      Dietrich Pates did not feel she needed to be diuresed.  5. Debility.  She is participating a little more in physical therapy  and actually is eating better.  6. Diarrhea.  I will add Flora-Q and continue to monitor her for heme      positive  stools.  7. Leukocytosis was improving.  As of yesterday she was 6.9.  Actually      since June 26 has been within normal limits with her white count.   DISPOSITION:  The patient insists that she is going home.  We need to  discuss this with the patient's family and make adequate arrangements  for a safe discharge.  Total time on this case was 25 minutes.      Melissa L. Ladona Ridgel, MD  Electronically Signed     MLT/MEDQ  D:  03/07/2009  T:  03/07/2009  Job:  [id number]

## 2011-01-21 NOTE — Group Therapy Note (Signed)
NAME:  Brianna Parker, Brianna Parker               ACCOUNT NO.:  [id number]   MEDICAL RECORD NO.:  [id number]          PATIENT TYPE:  INP   LOCATION:  A317                          FACILITY:  [facility]   PHYSICIAN:  Melissa L. Ladona Ridgel, MD  DATE OF BIRTH:  15-Jun-1922   DATE OF PROCEDURE:  03/01/2009  DATE OF DISCHARGE:                                 PROGRESS NOTE   The patient was seen and examined while lying in bed.  Nursing had noted  that her heart rate had changed, she had become excessively tachycardic.  I arrived at the bedside to see the patient awake, alert, oriented, in  no acute distress including no respiratory distress.  She stated she  felt jittery and that she needed her nerve pill.  She was referencing  her Tranxene.  The patient had not been prescribed for Tranxene during  this initial part of her admission.  The patient subsequently stated  that she did not feel short of breath.  She denied any chest pain,  nausea or vomiting and she actually had been doing fairly well.  An EKG  was obtained which showed sinus rhythm with premature atrial complexes,  rate was 96 when this was captured.  However, if you look at the  telemetry monitor she does have runs which appear either to be atrial  fibrillation or excessive APCs with rapid ventricular response.  The  patient's vital signs were obtained.  Her blood pressure was measured at  rest to be 143/70 and the nurses measured her respiratory rate at 40 but  this was while she was transferring from one bed to another to go  downstairs for her transvaginal ultrasound.  A subsequent evaluation in  the ultrasound suite showed that she had a respiratory rate of 20 and  was very comfortable without any complaints.  The patient was  subsequently placed on the cardiac monitor while receiving her  transvaginal ultrasound and was attended by her nurse.  Currently the  patient is very stable.  She has been up to her potty chair and states  that she got her  nerve pill and she feels much better.  She again denies  chest pain.  The patient was placed on a Cardizem drip low-dose without  bolus and labs were sent off to assess for myocardial infarction as well  as to ascertain whether or not we need to look for pulmonary embolus.   PHYSICAL EXAMINATION:  VITAL SIGNS:  Current vital signs, temperature  has been 97.6, blood pressure was 105/72, pulse 118, respirations 27,  saturation is 97% on 2 liters.  GENERAL:  This is a pleasant white female who is in charge of her own  decision making capacity.  HEENT:  Normocephalic, atraumatic.  Pupils equal, round and reactive to  light.  Extraocular muscles intact.  Mucous membranes are moist.  NECK:  Supple.  There is no JVD.  No lymph nodes.  No carotid bruits.  CHEST:  Her chest is decreased and I do not hear any wheezing nor do I  hear significant air movement but I also do not  hear any rhonchi or  rales.  CARDIOVASCULAR:  Regular with intermittent premature contractions.  Tachycardic, positive S1, S2, no S3, S4, no murmurs, rubs or gallops are  noted.  ABDOMEN:  Abdomen is soft, nontender, nondistended with positive bowel  sounds.  There is no hepatosplenomegaly, no guarding, no rebound at this  time.  EXTREMITIES:  Extremities show no clubbing, cyanosis or edema.  NEUROLOGICAL:  The patient is very hard of hearing but when you holler  at her and she does hear you she answers appropriately.  Cranial nerves  II-XII appear to be intact.  Power is 5/5 and DTRs are 2, plantars are  downgoing.  PSYCHIATRIC:  Affect is anxious.  Judgment and insight are somewhat  impaired by her wilfulness and perhaps a bit of poor judgment.   PERTINENT LABORATORY VALUES:  A D-dimer was found to be 2.59, her  magnesium 1.7.  The first set of cardiac markers early this morning  showed a creatinine kinase of 89, troponin of 0.09 which is slightly  elevated.  The cardiac markers at 1340 hours showed a creatinine kinase   of 113, troponin of 0.04.  Her sodium this morning was 130, potassium  3.7, chloride 95, CO2 30, glucose 88, BUN 16, creatinine was 0.94.   ASSESSMENT AND PLAN:  This is an 77 year old white female who presented  to the emergency room with complaints of weakness, loss of appetite and  decreased energy level.  She had actually lost 3 pounds and she was  starting to have chest pains.  She began running fevers and complained  of shortness of breath.  In the emergency room the patient was found to  have an ill-defined density in the right middle lobe which was felt to  be possibly pneumonia.  She was therefore recommended for admission to  the hospital.  The patient does have significant COPD and continues to  smoke.  1. Pneumonia.  The patient is on community acquired coverage with      azithromycin and ceftriaxone.  She seems to be tolerating that      well.  I will ask for a flutter valve q.1 h while awake and Mucinex      to try to loosen up any phlegm which might be present.  I will also      order a Xopenex in place of the albuterol since her heart rate is      irregular.  2. Tachycardia which appears to be an atrial tachycardia but does have      elements of atrial fibrillation.  The patient, therefore, has been      started on a Cardizem drip with some control of her rate.  However,      blood pressure has come down significantly and we may need to use      digoxin to assist in controlling her rate.  I am rehydrating her as      clinically she does appear a little dry.  Her D-dimer, however, is      positive and I will be recommending that we increase her Lovenox to      a milligram per kilogram coverage and that is q.12 hours.  I have      asked for cardiology to please evaluate the patient's rhythm for me      and with me.  I sent off serial cardiac markers and a thyroid is      pending.  TSH is pending.  3. Anxiety.  The patient desired to have her nerve pill back and       therefore I have restarted her Tranxene at 3.375 mg daily.  The      patient was given that and does feel somewhat better.  The patient      has described these episodes at home in that her daughter states      that the Tranxene helps so I am wondering if she has actually had      paroxysmal atrial fib before and was not aware of it.  4. Hypotension.  If the patient continues to have difficulty with that      I will have to discontinue the Cardizem and start her on digoxin      and/or use Lopressor for rate control.  5. Left adnexal mass was noted on CT scan of the abdomen and pelvis.      Transvaginal ultrasound was completed this morning.  It appears      that this is a fibroid but I will confirm the recommendations of      the radiologist which is probably just a followup scan in several      months.  6. Tobacco abuse.  The patient has a Nicoderm patch and we will do      tobacco cessation education.   Labs sent off after the heart rate became irregular show magnesium is  1.7.  We will replete that with a run of IV magnesium sulfate 2 grams  over 2 hours and some p.o. potassium.  She is just marginally low with  that.   At this time the patient is a full code.  I will discuss with the  patient's daughter and ultimately with the patient her wishes regarding  her advanced directives.      Melissa L. Ladona Ridgel, MD  Electronically Signed     MLT/MEDQ  D:  03/01/2009  T:  03/01/2009  Job:  [id number]

## 2011-01-21 NOTE — Group Therapy Note (Signed)
NAME:  Brianna Parker, Brianna Parker               ACCOUNT NO.:  [id number]   MEDICAL RECORD NO.:  [id number]          PATIENT TYPE:  INP   LOCATION:  A317                          FACILITY:  [facility]   PHYSICIAN:  Melissa L. Ladona Ridgel, MD  DATE OF BIRTH:  20-Jun-1922   DATE OF PROCEDURE:  DATE OF DISCHARGE:                                 PROGRESS NOTE   SUBJECTIVE:  The patient complains of no further diarrhea today, but she  says she still does not have an appetite.  She also would not sit up in  her chair for very long, but then complains that she wants to go home.  The patient remains hard of hearing and actually is also blind.  She  denies any nausea or vomiting.  She just does not feel like eating.   PHYSICAL EXAMINATION:  VITAL SIGNS:  T-max has been 98.2.  She has been  essentially afebrile now going on 48 hours.  The temperature this  morning was 98.8, blood pressure 109/72, pulses have remained elevated,  it appears between 84-140.  An EKG obtained by cardiology shows that  there may be an element of atrial fib under the MAT.  She is on 3 liters  of nasal cannot at 95%.  Her intake and output for the last 24 hours has  been judiciously recorded, but she has had at least three voids and only  one stool today.  GENERAL:  This is frail elderly female in no acute distress.  HEENT:  She is normocephalic, atraumatic.  Pupils are equal, round and  reactive, but she does have decreased vision.  She has anicteric  sclerae.  Examination of the nose reveals the septum midline with some  dryness.  Examination of the mouth reveals no oral lesions or lip  lesions.  NECK:  Supple.  There is no JVD.  No lymph nodes.  No carotid bruits.  CHEST:  Decreased and I do not hear as many coarse crackles on the right  base but she is dull on that side.  ABDOMEN:  Soft, nontender with an occasional twinge that she feels is  more diffuse in nature and related to the injection sites from her  Lovenox.  She has no  epigastric pain, no guarding, no rebound, no  hepatosplenomegaly.  EXTREMITIES:  Show no clubbing, cyanosis or edema.  NEUROLOGICAL:  She is awake and alert.  She is seemingly oriented to  time and place; however, I think that is a little bit deceptive.  She  did, however, remember that she wanted nasal saline and had not gotten  it yet.  NEUROLOGICAL:  Cranial nerves other than her vision and her hearing are  appearing to be intact.  Power is 4+/5 in the upper extremities and  lower extremities.  PSYCHIATRIC:  Affect is appropriate.  Recent and remote memory appear to  be somewhat intact, although I question that on occasion.  She is quite  determined to be at home and is very stubborn about her desires.   PERTINENT LABORATORIES:  Reveal a heme-positive stool x1.  Her sodium is  130  today, potassium 3.7, chloride 98, CO2 is 28, BUN is 7, creatinine  0.54 and glucose of 110.  Her LFTs are slightly elevated at 59 with an  ALT of 47.  Alkaline phosphatase is 66 and her total is 0.2.   Her CBC shows a white count of 7.3, hemoglobin 12.2, hematocrit 35.2 and  platelets of 181.  Sputum culture is pending.  Urine culture shows no  growth.  Her Foley catheter has been taken out.   ASSESSMENT/PLAN:  This is a pleasant blood, but determined 77 year old  female who presented with weakness, right-sided chest pain and found to  have right-sided pneumonia.  The patient's hospital course has  progressed to loosening of the cough, improvement in some of her  cognitive status and the onset of at least diarrhea times 1 day which  has improved.  The patient is, however, found to be Hemoccult positive  x1.   1. Pneumonia.  We will continue her antibiotics.  Cardiology is      requesting a pulmonary consult and we will order that for the      morning.  Her CT, however, was negative for PE and she will be day      5 of IV antibiotics today.  The only thing to consider is do we      need to add a third  antibiotic.  This was being treated as a      community acquired pneumonia.  I will await the results of her      sputum culture before doing so.  2. Diarrhea.  Clostridium difficile cultures have not been sent to the      best of my knowledge.  I did stop her senna and she is slightly      heme-positive, so she ultimately will need another gastrointestinal      workup.  The patient evidently has had poor appetite for some time      and it is noted that her liver enzymes are slightly elevated      compared to admission.  I will therefore go on to check a right      upper quadrant ultrasound and serial markers in the morning and      decide on whether a gastrointestinal consult is warranted.  3. Multifocal atrial tachycardia.  Cardiology has been following      along.  I up-titrated her Cardizem yesterday as her rates continued      to be elevated.  Dr. Dietrich Pates felt that the patient's EKG and may      have changed in obtained a repeat EKG which showed a possibility      for a conversion to atrial fibrillation.  We will continue to      follow recommendations.  4. Tobacco abuse.  She will be maintained on a nicotine patch.   Total time of this case is 25 minutes.      Melissa L. Ladona Ridgel, MD  Electronically Signed     MLT/MEDQ  D:  03/05/2009  T:  03/06/2009  Job:  [id number]

## 2011-01-21 NOTE — Group Therapy Note (Signed)
Brianna Parker, Brianna Parker               ACCOUNT NO.:  [id number]   MEDICAL RECORD NO.:  [id number]          PATIENT TYPE:  INP   LOCATION:  A317                          FACILITY:  [facility]   PHYSICIAN:  Edward L. Juanetta Gosling, M.D.DATE OF BIRTH:  1922/02/28   DATE OF PROCEDURE:  03/07/2009  DATE OF DISCHARGE:                                 PROGRESS NOTE   Brianna Parker seems to be doing better.  She has been sitting up some.  She is essentially afebrile.  She is not complaining of much shortness  of breath, but she still has problems with atrial fibrillation.   PHYSICAL EXAMINATION:  VITAL SIGNS:  Temperature now is 97.6, pulse is  88, but it has been much higher, respirations 18, blood pressure 93/55,  and O2 sat is 98% on 2 L.  CHEST:  Clearer.   ASSESSMENT:  She has pneumonia.  She has multiple other problems, atrial  fibrillation with multifocal atrial tachycardia, which is better and  plan will be for her to continue her treatments.      Edward L. Juanetta Gosling, M.D.  Electronically Signed     ELH/MEDQ  D:  03/08/2009  T:  03/08/2009  Job:  [id number]

## 2011-01-21 NOTE — Consult Note (Signed)
Brianna Parker, Brianna Parker               ACCOUNT NO.:  [id number]   MEDICAL RECORD NO.:  [id number]          PATIENT TYPE:  INP   LOCATION:  A317                          FACILITY:  [facility]   PHYSICIAN:  Gerrit Friends. Dietrich Pates, MD, FACCDATE OF BIRTH:  29-Oct-1921   DATE OF CONSULTATION:  03/02/2009  DATE OF DISCHARGE:                                 CONSULTATION   PRIMARY CARE PHYSICIAN:  Dr. Regina Eck in [place], [place].   REASON FOR CONSULTATION:  Tachyarrhythmia.   HISTORY OF PRESENT ILLNESS:  Brianna Parker is an 77 year old female  patient with no history of CAD, who presented to her primary care  physician's office recently with complaints of weakness and loss of  appetite.  She notes dyspnea with exertion and cough, productive of  clear to white sputum.  She began to feel worse and eventually came to  [facility] Emergency Room.  Her initial temperature was 102  degrees, Fahrenheit.  Her chest x-ray was consistent with right middle  lobe pneumonia.  She has been admitted and treated for community-  acquired pneumonia.  She has been noted to have increased heart rates,  suspicious for atrial fibrillation.  We were asked to further evaluate.  She is very hard of hearing but able to communicate well.  Her  tachyarrhythmia seems to largely be asymptomatic.  She denies any chest  pain or history of syncope.  On review of her telemetry, she has  multifocal atrial tachycardia with heart rates in the 100-120 range.  She has nonspecific ST-T wave changes on electrocardiogram.  Cardiac  markers have been obtained thus far and are essentially negative, except  for an initial troponin of 0.09.  Follow-up troponin was 0.04.  D-dimer  is 2.59 and there are apparent plans for chest CT at some point.  Of  note, she was also complaining of significant amounts of abdominal pain  on admission.  Pelvic CT scan demonstrated cholelithiasis, nonspecific  small amount of left perinephric fluid and  a left adnexal mass.  Followup pelvic ultrasound demonstrated questionable subserosal fibroid  and MRI, versus followup ultrasound, was recommended.  Chest x-ray  demonstrates advanced changes of COPD, along with areas of scarring,  tortuous ectatic thoracic aorta and opacity in the right middle lobe  that is likely pneumonia.   PAST MEDICAL HISTORY:  1. Hypertension.  2. Borderline diabetes mellitus.  3. Osteoarthritis.  4. Degenerative disk disease/spinal stenosis, status post back surgery      in 2006.  5. Glaucoma/macular degeneration.   MEDICATIONS AT HOME:  1. Aspirin 325 mg daily.  2. Darvocet N 100 p.r.n.  3. Tranxene 3.75 mg daily.  4. Lisinopril/HCTZ 20/12.5 mg daily.  5. Detrol daily.  6. Timolol eye drops.  7. Alphagan eye drops.   ALLERGIES:  NO KNOWN DRUG ALLERGIES.   SOCIAL HISTORY:  The patient lives in [place] by herself.  Her  daughter and son both check in on her quite often.  She is a widow.  She  has a 60+ pack-year history of smoking and continues to smoke one to two  packs  of cigarettes per day.  She is retired mainly from Investment banker, corporate  work.  She denies alcohol abuse.   FAMILY HISTORY:  Significant for CAD.  She had a brother who had bypass  surgery in his 39s.  She also had a brother that died from congestive  heart failure complications.   REVIEW OF SYSTEMS:  Please see HPI.  Denies orthopnea, PND, pedal edema,  syncope, bright red blood per rectum or melena, dysuria or hematuria.  She does have a history of odynophagia.  All other systems reviewed and  negative.   PHYSICAL EXAM:  GENERAL:  She is a well-nourished, well-developed,  elderly female in no acute distress.  VITAL SIGNS:  Blood pressure is 112/67, pulse 122, respirations 30,  temperature 98, oxygen saturation 99% on 2 liters.  HEENT:  Normal.  NECK:  Without JVD.  LYMPH:  Without lymphadenopathy.  ENDOCRINE:  Without thyromegaly.  CARDIAC:  Normal S1, S2, regular rate and rhythm  without murmur.  LUNGS:  With decreased breath sounds bilaterally.  Prolonged expiratory  phase.  No wheezing at present - she just finished a breathing  treatment.  SKIN:  Warm and dry.  ABDOMEN:  Soft, nontender with normoactive bowel sounds.  No  organomegaly.  EXTREMITIES:  Without clubbing, cyanosis or edema.  MUSCULOSKELETAL: Without joint deformity.  NEUROLOGIC:  She is alert and oriented x3.  Cranial nerves II-XII ARE  grossly intact.  VASCULAR:  No carotid bruits noted bilaterally.   EKG:  Multifocal atrial tachycardia, normal axis, heart rate 115,  nonspecific ST-T wave changes.   LABS:  Hemoglobin 12.9.  Potassium 3.7, creatinine 0.94, magnesium 1.7,  albumin 3.4.  Cardiac markers as outlined above.  D-dimers outlined  above.  Total cholesterol 140, triglycerides 70, HDL 66, LDL 60.   ASSESSMENT:  1. Tachycardia, most consistent with multifocal atrial tachycardia.  2. Right middle lobe pneumonia.  3. Elevated D-dimer.  4. Hyponatremia - question syndrome of inappropriate antidiuretic      hormone.  5. Hypertension.  6. Borderline diabetes mellitus.  7. Degenerative disk disease/chronic back pain.  8. Abdominal pain - workup pending.   RECOMMENDATIONS:  The patient was also interviewed and examined by Dr.  Dietrich Pates.  Prior records have been reviewed.  The patient has  essentially no cardiac history and no cardiac symptoms at present.  Her  rhythm is multifocal atrial tachycardia.  Her peak heart rate is less  than 130.  Therapy will probably not be effective, given her acute  illness.  However, we can certainly try calcium channel blockers in the  form of Diltiazem or verapamil to try to control her heart rate.  TSH is  pending, as well as an echocardiogram.   Thank you very much for the consultation.  We will be glad to follow the  patient throughout the remainder of this admission.      Tereso Newcomer, PA-C      Gerrit Friends. Dietrich Pates, MD, [facility]   Electronically Signed    SW/MEDQ  D:  03/02/2009  T:  03/02/2009  Job:  [id number]   cc:   Erasmo Downer, MD

## 2011-01-21 NOTE — Group Therapy Note (Signed)
NAME:  Brianna Parker, Brianna Parker               ACCOUNT NO.:  [id number]   MEDICAL RECORD NO.:  [id number]          PATIENT TYPE:  INP   LOCATION:  A317                          FACILITY:  [facility]   PHYSICIAN:  Melissa L. Ladona Ridgel, MD  DATE OF BIRTH:  March 23, 1922   DATE OF PROCEDURE:  03/02/2009  DATE OF DISCHARGE:                                 PROGRESS NOTE   Patient is seen and evaluated, lying in bed.  She states, this morning,  I'm sick.  The patient complains of some right-sided chest pain, but  otherwise denies shortness of breath.  She is not expressing any  significant cough or sputum production, nor is she in any respiratory  distress.  She states she did not eat her breakfast this morning because  of the discomfort and she wanted to get her appetite back.   T-max was 98.4.  This morning she is 99.9 and 97.1, respectively.  Blood  pressure 111/63, heart rate 96-103.  Intake and output for the last 24  hours:  1000 in, 150 out with subsequent placement of a Foley catheter  for urinary retention.  The patient had two stools yesterday.   PHYSICAL EXAM:  This is a moderately nourished white female, in no acute  distress.  She is very hard of hearing.  HEENT:  She is normocephalic, atraumatic.  Pupils were equal, round and  reactive to light.  Extraocular muscles are intact.  Mucous membranes  are moist.  NECK:  Supple.  There is no JVD, no lymph nodes, no carotid bruits.  CHEST:  Reveals increased coarse crackles in the right base and right  posterolateral area, especially in the area where the patient is feeling  pain.  She otherwise has decreased breath sounds bilaterally.  No  wheezing is present, but she does have a slight tweak at the end on the  right base.  CARDIOVASCULAR:  Regular with frequent ventricular contractions.  I do  not hear a murmur, rub or gallop.  ABDOMEN:  Soft, nontender, nondistended with positive bowel sounds.  There is no hepatosplenomegaly, no guarding, no  rebound.  EXTREMITIES:  Show no clubbing, cyanosis or edema.  NEUROLOGICALLY:  She is incredibly hard of hearing, but seems to  understand what I am saying and is quite set in her ways.  Cranial  nerves II through XII appear to be intact.  Power is 5/5.  She is able  to stand and pivot to go to the bathroom.   PERTINENT LABORATORIES TODAY:  Reveal a urine culture that is negative.  Her sodium is 129 with a potassium of 3.6, chloride 98, CO2 28, BUN 10,  creatinine is 0.79 and glucose is 120.  CBC is white count elevated at  11.5, hemoglobin 12.0, hematocrit 35 and platelets of 151.  Cardiac  markers yesterday showed a slight elevation in her troponin at 0.7, but  she has no correlation with a CK or MB.  Her TSH is 1.013.  Lipids show  a total cholesterol of 140, LDL 60, HDL 66.  Her D-dimer was 2.59, but  she had had IV  contrast yesterday and, therefore, could not have a CT  scan of the chest.  Her Lovenox was increased to twice the dose.   ASSESSMENT AND PLAN:  This is a pleasant 77 year old white female who  presented to the emergency room with decreased appetite and weakness.  She was discovered to have a right pneumonia and was admitted to the  hospital for further care.  Patient subsequently developed tachycardia,  which has been diagnosed as multifocal atrial tachycardia.  She,  therefore, has been started on oral Diltiazem and is doing quite well  with that.  Today, however, her right chest is hurting her and I am  hearing more coarse rhonchi without wheezing.   1. Right lower lobe pneumonia.  Patient is being maintained on day #2      of azithromycin and ceftriaxone.  I will go ahead and obtain a CT      scan of her chest today to assure that this pneumonia has no other      issues related to it, as the patient is a heavy smoker and has an      elevated D-dimer with the onset of atrial tachycardia yesterday.      Patient will be maintained on nebulizers and pulmonary toilet.       Mucinex has been added.  2. Left adnexal mass.  Patient underwent ultrasound study yesterday,      showing only a possible fibroid tumor, no masses present.  3. Tachycardia.  This is improved with Cardizem.  TSH is within normal      limits.  Would just continue her on rate-controlling agents.  4. Anxiety.  The patient is tolerating her Tranxene very well.  5. Hypotension that is improved with mild IV hydration.  6. Hyponatremia.  This persists.  It is actually just a little bit      less today, but we will continue to monitor her.  She did get some      free water yesterday with a normal saline bolus, so we will      continue to monitor.  7. Degenerative disc disease and back pain.  I will allow her to have      a small dose of Toradol, which also may assist in helping her with      her pleuritic chest pain, but I will not continue that for too many      days.   The patient is a full-code and she desires to disposition to her home  for care.  We will continue to monitor that situation and whether or not  she will need a brief rehab stay.   Total time in this case was 30 minutes.      Melissa L. Ladona Ridgel, MD  Electronically Signed     MLT/MEDQ  D:  03/02/2009  T:  03/02/2009  Job:  [id number]

## 2011-01-21 NOTE — Group Therapy Note (Signed)
NAMEAMAREE, Brianna Parker               ACCOUNT NO.:  [id number]   MEDICAL RECORD NO.:  [id number]          PATIENT TYPE:  INP   LOCATION:  A317                          FACILITY:  [facility]   PHYSICIAN:  Edward L. Juanetta Gosling, M.D.DATE OF BIRTH:  1921-10-04   DATE OF PROCEDURE:  03/09/2009  DATE OF DISCHARGE:  03/09/2009                                 PROGRESS NOTE   The patient of the hospitalist.   Ms. Brianna Parker seems to be improving on a daily basis.  She has no other  new complaints.   Her temperature is 98.4, pulse 108, respirations 18, blood pressure  153/84, O2 sats 92% on 2 L.   Her chest x-ray yesterday showed that she was improving and did have  COPD.  She has apparently been up and walking around and hopefully, she  is going to be able to be discharged fairly soon.   My plan then is to continue with her treatments, medications, and  follow.      Edward L. Juanetta Gosling, M.D.  Electronically Signed     ELH/MEDQ  D:  03/09/2009  T:  03/10/2009  Job:  [id number]

## 2011-01-21 NOTE — Group Therapy Note (Signed)
NAMECATHALINA, BARCIA               ACCOUNT NO.:  [id number]   MEDICAL RECORD NO.:  [id number]          PATIENT TYPE:  INP   LOCATION:  A317                          FACILITY:  [facility]   PHYSICIAN:  Edward L. Juanetta Gosling, M.D.DATE OF BIRTH:  Jun 10, 1922   DATE OF PROCEDURE:  03/08/2009  DATE OF DISCHARGE:                                 PROGRESS NOTE   Ms. Tetro seems to continue to improve.  She continues to have some  problems with atrial fibrillation, but she is improving as far as her  pneumonia is concerned.  I agree with Dr. Lubertha Basque note from yesterday  that she is going to need another chest x-ray to make sure that things  look okay and I agree with a 10-day course of antibiotics.  Assuming  that her chest x-ray looks like it is about the same or improving.  I  will plan to sign off.      Edward L. Juanetta Gosling, M.D.  Electronically Signed     ELH/MEDQ  D:  03/08/2009  T:  03/08/2009  Job:  [id number]

## 2011-01-21 NOTE — Group Therapy Note (Signed)
NAME:  Brianna Parker, Brianna Parker               ACCOUNT NO.:  [id number]   MEDICAL RECORD NO.:  [id number]          PATIENT TYPE:  INP   LOCATION:  A317                          FACILITY:  [facility]   PHYSICIAN:  Melissa L. Ladona Ridgel, MD  DATE OF BIRTH:  08-Sep-1922   DATE OF PROCEDURE:  03/06/2009  DATE OF DISCHARGE:                                 PROGRESS NOTE   Subjectively, the patient feels better today.  She said she feels like  eating.  She denies any diarrhea.  She states her shortness of breath is  improving slightly.   Her T-max of 99, temperature today 98.5.  Blood pressure 114/79, pulse  90, respirations 16, saturation 97% on 3 liters.  Intake and output for  the last 24 hours reveals multiple voids and only one stool this morning  that was formed according to the patient.  GENERALLY:  This is a frail white female in no acute distress.  She is  normocephalic, atraumatic.  Pupils equal, round, reactive to light.  Extraocular muscles intact.  She has poor vision.  There is no nasal  discharge.  There are no oral or lip lesions.  NECK:  Supple.  There is no JVD.  No lymph nodes.  No carotid bruits.  CHEST:  Decreased bilaterally with coarse crackles at the right base.  No wheezes are noted.  CARDIOVASCULAR:  Irregular, positive S1, S2, no S3, S4.  No murmurs,  rubs or gallops.  ABDOMEN:  Soft, nontender, nondistended with positive bowel sounds.  There is no hepatosplenomegaly, no guarding, no rebound.  EXTREMITIES:  Show no clubbing, cyanosis or edema.  NEUROLOGICALLY:  The patient is blind.  She is very hard of hearing.  Otherwise cranial nerves appear to be intact.  Power:  She is able to  assist in transitioning to bed.  Grip strength is intact symmetrically  bilateral.   ASSESSMENT/PLAN:  This is an 77 year old white female with an admission  for right lower lobe pneumonia.  Course has been complicated by  decreased appetite, abdominal discomfort, a Hemoccult positive stool now  in  atrial fibrillation.  She has had hyponatremia which is improved off  of the IV fluids.  Her liver enzymes continue to remain just slightly  elevated.  1. Pneumonia, slowly but surely improving.  I have asked Dr. Juanetta Gosling      to see her and await his consultation.  She remains on azithromycin      and ceftriaxone as well as aggressive pulmonary toilet.  2. Hemoccult positive stools.  The patient remains on Lovenox a      milligram per kilogram because of her atrial fibrillation.  She has      had no further diarrhea and her complete blood count has remained      stable between 11-12.  I therefore have not discontinued the      Lovenox since I have no other indications that she is acutely      bleeding especially in light of her current atrial fibrillation.  3. Atrial fibrillation.  The patient is being seen by Dr. Dietrich Pates and  her medications are being titrated upwards.  4. Diastolic dysfunction.  Her BNP is slightly elevated.  She is not      excessively short of breath nor does she show peripheral signs of      congestive heart failure but I guess the question becomes if her      pneumonia does not improve, is there an element of congestive heart      failure underneath that.  At this time I do not feel strongly about      diuresing her but the question remains in the back of my mind.  5. Debility.  The patient has participated a little bit better today      with physical therapy and has the desire to do so.  6. Diarrhea.  No further event.  She will ultimately need an      outpatient workup as she did have one Hemoccult positive stool.  7. Hypernatremia, improved on decreased doses of IV fluids.  8. Leukocytosis that has improved significantly and has now      normalized.  9. We will continue to adjust the patient's medications.   DISPOSITION:  Will be determined by her family who have been discussing  the potential for the need for rehabilitation.  The patient however is   resistant to this.  Total time on this case is 25 minutes.      Melissa L. Ladona Ridgel, MD  Electronically Signed     MLT/MEDQ  D:  03/06/2009  T:  03/06/2009  Job:  [id number]

## 2011-01-21 NOTE — Group Therapy Note (Signed)
NAME:  Brianna Parker, Brianna Parker               ACCOUNT NO.:  [id number]   MEDICAL RECORD NO.:  [id number]          PATIENT TYPE:  INP   LOCATION:  A317                          FACILITY:  [facility]   PHYSICIAN:  Melissa L. Ladona Ridgel, MD  DATE OF BIRTH:  [birth year]   DATE OF PROCEDURE:  03/03/2009  DATE OF DISCHARGE:                                 PROGRESS NOTE   Subjectively, the patient seems improved today.  She complains of less  pain in her chest.  She did complain of a little bit of bladder pain.  She still has a fever, low-grade, and has some pink in her cheeks today.  She is chatty and states that she ate a little bit better.  She denies  nausea, vomiting.   T-max was 100.5 early this morning, blood pressure 110/65, heart rates  ranging from 86-135, saturation in 89% to 90% on 2-3 liters of oxygen.  GENERAL:  This is a frail-appearing white female in no acute distress  who is being treated for right-sided pneumonia.  She is normocephalic,  atraumatic.  Pupils equally round, reactive to light.  She has very poor  hearing and evidently very poor vision.  She otherwise has anicteric  sclerae.  NOSE:  Reveals septum midline without discharge.  MOUTH:  Reveals no oral lesions or lip lesions.  NECK:  Supple.  There is no JVD, no lymph nodes, no carotid bruits.  CHEST:  Reveals less coarse crackles in th right base with no wheezing.  The left base is less dull and is moving a little bit more air than had  been on the days prior.  CARDIOVASCULAR:  She has a regular rhythm with frequent APCs.  I do not  appreciate a murmur, rub or gallop.  ABDOMEN:  Soft, nontender, nondistended with positive bowel sounds.  No  hepatosplenomegaly, no guarding, no rebound.  EXTREMITIES:  Show no clubbing, cyanosis or edema.  NEUROLOGICALLY:  The patient is very hard of hearing and she is quite  determined that she is going home without going for rehabilitation.  We  started to plant the seeds with regard to this.   PERTINENT LABORATORY VALUES:  Her sodium is 129, it has been riding 129-  130, potassium 3.3, chloride 100, CO2 26, BUN is 9, creatinine 0.71 and  a glucose of 114.  Her CBC reveals a white count of 8.3, hemoglobin  11.5, hematocrit 33.2 and platelets of 152.  Urine culture has had no  growth.  TSH was 1.013.   CT angio was completed which showed no obvious pulmonary emboli.  She  has aneurysmal dilatation of the thoracic aorta, there is mural thrombus  noted in the descending distal part of the thoracic aorta.  A patchy  dense air space consolidation is demonstrated in the right lower lobe  and right middle lobe.  There is a moderate-sized right pleural  effusion, small left pleural effusion.  She has mild changes of COPD.   ASSESSMENT/PLAN:  This is an 77 year old white female who presented with  weakness, lethargy, found to have a right-sided pneumonia.  She had  presented with some chest pain which is pleuritic in nature.  Her D-  dimer was elevated so we did rule out pulmonary embolism with CT scan  and that is negative.  1. Pneumonia.  Continue her ceftriaxone and azithromycin, as-needed      Toradol for pleuritic chest pain, nebulizers and a flutter valve as      well as aggressive pulmonary toilet.  2. Urinary retention.  A Foley catheter has been in place for 2 days.      I am going to try to discontinue that in the morning and use a      bladder scanner to see what her post-void residuals are.  If she      continues to retain urine she will need a urology consult      eventually and may need to take a catheter home with her.  3. Hyponatremia.  This low but stable.  I am going to discontinue the      normal saline, which we have been rehydrating her with, and let her      rehydrate orally; at this point, that may improve her sodium.  4. A low but potassium of 3.3 is noted.  I will replete her with      potassium chloride by mouth.  5. Multifocal atrial tachycardia.  The  patient has been placed on      diltiazem by mouth.  Will continue to monitor.  That may need to be      up-titrated as today she has had several episodes of lengthy      periods of tachycardia without hemodynamic instability.  6. Depression and anxiety.  Will continue her Tranxene.  7. Glaucoma.  She will continue her Timoptic.  8. Tobacco abuse.  She will be maintained on a nicotine patch.   DISPOSITION:  At this time, the patient's family is looking for skilled  nursing rehabilitation and therapy.   TOTAL TIME ON THIS CASE:  30 minutes.      Melissa L. Ladona Ridgel, MD  Electronically Signed     MLT/MEDQ  D:  03/03/2009  T:  03/03/2009  Job:  [id number]

## 2011-01-21 NOTE — Discharge Summary (Signed)
NAMEJERSEY, Brianna Parker               ACCOUNT NO.:  [id number]   MEDICAL RECORD NO.:  [id number]         PATIENT TYPE:  PINP   LOCATION:  A317                          FACILITY:  [facility]   PHYSICIAN:  Renee Ramus, MD       DATE OF BIRTH:  04/07/1922   DATE OF ADMISSION:  DATE OF DISCHARGE:  07/02/2010LH                               DISCHARGE SUMMARY   PRIMARY DISCHARGE DIAGNOSIS:  Pneumonia.   SECONDARY DIAGNOSES:  1. Atrial fibrillation.  2. Diastolic congestive heart failure, chronic.  3. Debility.  4. Chronic back pain.  5. Osteoarthritis.  6. Chronic glaucoma.   HOSPITAL COURSE BY PROBLEM:  1. Pneumonia.  The patient is an 77 year old female who is admitted      secondary to pain, weakness, and cough.  The patient came in with a      fever.  She was seen in the emergency department, admitted to our      service.  The patient did have a chest x-ray showing airspace      consolidation within the right, middle, and lower lobes as well as      a mild atelectasis and infiltrate in the left lower lobe.  The      patient initially had leukocytosis.  She was placed on broad-      spectrum antibiotics.  She has completed a 10-day course and now,      she is being discharged to home without further antibiotic      treatment.  The patient's white count is normalized.  Her symptoms      have cleared, and we believe that she is stable for discharge.      Changes seem to be lagging on the x-rays and she still has      persistent right middle and right lower lobe airspace disease that      were not seen on previous films, but we believe that she has      completed her antibiotic course, and there is no definitive reason      to pursue with this.  The patient was seen by Pulmonary during her      hospitalization, and he agrees with this assessment.  2. Atrial fibrillation.  The patient is on anticoagulation.  She is      rate controlled with diltiazem.  She is currently subtherapeutic.  However, she will continue to take her Coumadin.  I believe that      she will become therapeutic in the not-too-distant future.  The      patient does not require co-treatment with low-molecular-weight      heparin since this is a stroke prophylaxis, and she will follow up      with Dr. Erasmo Downer for adjustment of her Coumadin dosage.  3. Congestive heart failure.  The patient does have evidence of      diastolic dysfunction.  This has been stable, and she has not had      any exacerbation of this while in hospital.  4. Disability.  The patient will require home health and home PT for  a      longterm basis.  5. Chronic back pain.  The patient treats this with medicine only.      She is not a surgical candidate for advanced osteoarthritis.  6. Glaucoma.  The patient will continue treatment with alpha blocker.  7. History of incontinence.  The patient will continue her Detrol.   LABORATORY DATA OF NOTE:  1. Leukocytosis.  White count 17.8 upon admission, decreasing to 8.4      on day of discharge.  2. Mild anemia with a hemoglobin of 11.9 and hematocrit of 34.1.  3. INR on the day of discharge of 1.7.  4. Mild elevation of blood glucose at 107.  5. BNP elevated at 753.  6. Total cholesterol 140 with an HDL of 66 and LDL of 60.  7. TSH of 1.013.  8. UA showing ketonuria but no evidence of infection or abnormal      concentration.  9. Fecal occult blood test showing both, positive and negative results      with no gross blood per rectum noted.  10.C. diff negative.   STUDIES:  1. Pelvic ultrasound showing normal postmenopausal endometrium and      ovaries with question of a subserosal fibroid that was not      definitively shown.  2. CT of the abdomen showing consolidation in the inferolateral right      middle lobe, evidence of gallstones without any type of ductal      dilatation and nonspecific left perinephric stranding was observed.  3. CT angiogram of the chest showing no  evidence of pulmonary emboli      but persistent right middle and lower lobe pneumonia with a small      right-sided pleural effusion.  4. Followup ultrasound of the abdomen showing a normal common bile      duct, evidence of cholelithiasis without evidence of cholecystitis,      and a small right pleural effusion.  5. Chest film done on day prior to discharge showing cardiac      enlargement, small bibasilar effusions, and again persistent      consolidation of right middle and right lower lobes although they      believe that this has improved in the interim.   MEDICATIONS ON DISCHARGE:  1. Aspirin, which has been reduced to 81 mg p.o. daily.  2. Darvocet-N 100, 2 p.o. q.6 h. p.r.n. pain.  3. Tranxene 1 tablet p.o. daily.  4. Lisinopril/hydrochlorothiazide 20/12.5 one p.o. daily.  5. Detrol 1 p.o. daily.  6. Timolol 0.25% 1 drop each eye q.a.m.  7. Alphagan 0.17% 1 drop each eye at bedtime.  8. Coumadin 2.5 mg p.o. daily.  9. Digoxin 0.25 mg p.o. daily.  10.Diltiazem CD 360 mg p.o. daily.   There are no labs or studies pending at the time of discharge.  The  patient is in stable condition and anxious for discharge.  The patient  will follow up with Dr. Erasmo Downer for her PT/INR at the beginning  of next week.   TIME SPENT:  35 minutes.      Renee Ramus, MD  Electronically Signed     JF/MEDQ  D:  03/09/2009  T:  03/10/2009  Job:  [id number]   cc:   Erasmo Downer, MD

## 2011-01-21 NOTE — H&P (Signed)
Brianna Parker, Brianna Parker               ACCOUNT NO.:  [id number]   MEDICAL RECORD NO.:  [id number]          PATIENT TYPE:  INP   LOCATION:  A317                          FACILITY:  [facility]   PHYSICIAN:  Osvaldo Shipper, MD     DATE OF BIRTH:  19-Jul-1922   DATE OF ADMISSION:  02/28/2009  DATE OF DISCHARGE:  LH                              HISTORY & PHYSICAL   PRIMARY CARE PHYSICIAN:  Erasmo Downer, M.D.   ADMISSION DIAGNOSES:  1. Community-acquired pneumonia.  2. Abdominal pain, etiology unclear.  3. Extreme hard of hearing.  4. Chronic back pain.  5. History of hypertension.   CHIEF COMPLAINT:  Pain all over.   HISTORY OF PRESENT ILLNESS:  The patient is an 77 year old Caucasian  female who is extremely hard of hearing and history is very difficult to  obtain.  She is accompanied by two of her daughters who provide a lot of  history.  Apparently she was in her usual state of health until about 2-  3 weeks ago when she started feeling weak.  She lost her appetite.  Her  energy level went down.  She lost about three pounds in the last few  weeks.  Then she started having chest pains on both sides and then she  has been running some fever at night, though she does not really check  her temperature.  She has been short of breath.  No nausea or vomiting.  She had bowel movement today after she gave herself a suppository.  She  is complaining of back pain which is chronic ever since her back  surgery.  She has had some problems with GYN infections for which she  saw a gynecologist.  She has arthritis.  She states she is finding it  difficult to walk because of weakness.  So basically a very scattered  and sketchy history at this time.   MEDICATIONS AT HOME:  She is on the following:  1. Aspirin 325 mg daily.  2. Darvocet-N 100 every few hours as needed for pain.  3. Tranxene T-tabs 3.75 mg once a day.  4. Lisinopril/HCTZ 20/12.5 once a day.  5. Detrol samples.  She has been taking these  for the last two days.  6. Timolol eye drops in the morning and Alphagan eye drops at night.   ALLERGIES:  No known drug allergies.   PAST MEDICAL HISTORY:  1. Positive for arthritis.  2. Hypertension.  3. Back pain.  4. Hearing loss.  5. Back surgery in 2006 for spinal stenosis.  She also has a bulging      disk.  6. She has problems with incontinence.  7. She has glaucoma.  8. No history of heart disease or stroke.  9. No history of dementia.  10.She has refused colonoscopies in the past.  She has never had any      kind of workup done for her abdominal discomfort which she has had      for awhile as well.   SOCIAL HISTORY:  She lives alone in an apartment in [place], uses a  cane  usually.  She still smokes two packs of cigarettes on a daily  basis.  Denies any alcohol use or illicit drug use.   FAMILY HISTORY:  Positive for heart disease, macular degeneration,  stroke, high blood pressure.   REVIEW OF SYSTEMS:  Difficult to do because of hearing impairment.   PHYSICAL EXAMINATION:  VITAL SIGNS:  Temperature initially was 97.5 and  then she shot up to 102 rectally.  Blood pressure initially was 98/78,  dropping down to 89/55 and then coming up to 134/74.  Heart rate 80s and  regular with premature beats.  Saturations 92%.  I think this was done  on O2.  GENERAL:  This is a frail, elderly white female in no distress.  HEENT:  There is no pallor, no icterus.  Oral mucous membranes slightly  dry.  No oral lesions are noted.  Head also is normocephalic,  atraumatic.  NECK:  Soft and supple.  No thyromegaly is appreciated.  No  lymphadenopathy is appreciated.  LUNGS:  Reveal decreased air entry at the bases.  Poor effort.  Few  rhonchi on the right side but no definite crackles.  CARDIOVASCULAR:  S1/S2 normal.  Regular with premature beats.  No S3/S4.  No rubs, no bruits.  No JVD is noted.  Peripheral pulses are palpable.  No pedal edema at present.  ABDOMEN:  Soft.  Tenderness in the lower quadrant is appreciated but no  rebound, rigidity, or guarding is present.  Bowel sounds are present.  No masses are appreciated.  MUSCULOSKELETAL:  Exam unremarkable.  NEUROLOGIC:  She is alert, hard of hearing.  No focal neurological  deficits are present.  SKIN:  Shows no rashes.   LABORATORY DATA:  White count is 17,800 with 86% neutrophils.  Hemoglobin is normal, MCV is normal.  Platelet count is normal.  Sodium  is 131, potassium 4.3, chloride 90, bicarb 31, BUN 22, creatinine 1.1,  glucose 105.  LFTs are normal.  Albumin is 3.4.  UA shows small ketones,  otherwise negative.   Imaging studies:  She had a chest x-ray which showed advanced changes of  COPD along with areas of scarring, tortuous, ectatic, thoracic aorta is  noted.  Ill-defined air space opacity in the right middle lobe  consistent with pneumonia.   No EKG has been done.   ASSESSMENT:  This is an 77 year old Caucasian female who presents with  generalized symptoms of chest pain, found to have a fever, and now has  most likely pneumonia.  This goes along with the elevated white count as  well as her fever.  The chest pains could be coming from this pneumonia.  Abdominal  pain is somewhat worrisome, although it could be because of  constipation that the patient's family alluded to.  She was initially  hypotensive in the ED but that has improved with IV hydration.   PLAN:  1. Community-acquired pneumonia.  Treat with ceftriaxone and      azithromycin.  Nebulizers will be utilized as needed.  Physical      therapy will be required.  2. Abdominal pain.  Will get a CAT scan of the abdomen as the family      is very concerned about this.  This has been ongoing for many weeks      now.  PMD has not pursued this at this time.  The patient has      refused colonoscopies in the past.  She has lost weight so we will  definitely get a CAT scan.  3. Chest pain, likely due to pneumonia.  Will  check an EKG and cardiac      markers.  4. Hypotension.  Resolved with IV fluids.  Keep a close watch on her      blood pressure and hold her antihypertensive agents for now.  5. History of glaucoma.  Continue atenolol and Alphagan.  6. Osteoarthritis and back pain.  Continue with analgesics as needed.  7. DVT prophylaxis will be provided.  8. Mild dehydration.  Will give her mild gentle IV hydration to avoid      any pulmonary edema.   Further management decisions will depend on results of further testing  and patient's response to treatment.      Osvaldo Shipper, MD  Electronically Signed     GK/MEDQ  D:  02/28/2009  T:  03/01/2009  Job:  [id number]   cc:   Erasmo Downer, MD

## 2011-01-24 NOTE — Op Note (Signed)
NAMEDAKSHA, KOONE               ACCOUNT NO.:  [id number]   MEDICAL RECORD NO.:  [id number]          PATIENT TYPE:  OIB   LOCATION:  2899                         FACILITY:  [facility]   PHYSICIAN:  Tia Alert, MD     DATE OF BIRTH:  [birth year]   DATE OF PROCEDURE:  10/02/2004  DATE OF DISCHARGE:                                 OPERATIVE REPORT   PREOPERATIVE DIAGNOSIS:  Lumbar spinal stenosis L4-5 of the back and  bilateral leg pain.   POSTOPERATIVE DIAGNOSES:  Lumbar spinal stenosis L4-5 of the back and  bilateral leg pain.   PROCEDURE:  Decompressive lumbar hemilaminectomy, medial facetectomy,  foraminotomy L4-5 on the right with sublaminar decompression for central and  left-sided canal decompression.   SURGEON:  Tia Alert, M.D.   ASSISTANT:  Donalee Citrin, M.D.   ANESTHESIA:  General tracheal.   COMPLICATIONS:  None apparent.   INDICATIONS FOR PROCEDURE:  Ms. Orea is an 77 year old white female who  was referred with back and bilateral leg pain right greater than left. She  had an MRI which showed lumbar spinal stenosis at L4-5. She had tried  medical management for quite some time without significant relief. I  recommended lumbar decompressive hemilaminectomy with sublaminar  decompression. She understood the risks, benefits, and expected outcome and  wished to proceed.   DESCRIPTION OF PROCEDURE:  The patient was taken to the operating room and  after induction of adequate generalized endotracheal anesthesia, she was  rolled into the prone position on the Wilson frame and all pressure points  were padded and her lumbar region was prepped with DuraPrep and then draped  in the in the usual sterile fashion. 5 cc of local anesthesia was injected  then and a dorsal midline incision was made and carried down to the  lumbosacral fascia. The fascia was opened and the paraspinous musculature  was taken down in a subperiosteal fashion to expose the L4-5 interspace on  the right side. Intraoperative x-ray confirmed my level and then the use of  the high-speed air powered Black Max drill and the Kerrison punch was used  to perform a hemilaminectomy, medial facetectomy and foraminotomy at L4-5 on  the right side. I was then able to drill under the spinous process and  perform a sublaminar decompression at L4-5 also with a Kerrison punch. She  had a significant overgrown yellow ligament which was removed, I dissected  to the medial pedicle wall and identified the L5 nerve roots bilaterally.  Once the decompression was completed, I irrigated with saline solution  containing bacitracin and lined the dura with Duragen and then dried all  bleeding points with bipolar cautery and Bovie cautery and then closed the  fascia with interrupted #1 Vicryl, close the subcutaneous and subcuticular  tissues with 2-0 and 3-0 Vicryl and closed  the skin with  Dermabond. The drapes removed. Sterile dressing was applied.  The patient was awakened from anesthesia and transferred to recovery room in  stable condition. At the end of the procedure all sponge, needle and sponge  counts were correct.  DSJ/MEDQ  D:  10/02/2004  T:  10/02/2004  Job:  [id number]

## 2013-12-23 ENCOUNTER — Inpatient Hospital Stay (HOSPITAL_COMMUNITY)
Admission: EM | Admit: 2013-12-23 | Discharge: 2013-12-27 | DRG: 291 | Disposition: A | Discharge status: Hospice | Payer: Medicare Other | Attending: Pulmonary Disease | Admitting: Pulmonary Disease

## 2013-12-23 ENCOUNTER — Emergency Department (HOSPITAL_COMMUNITY): Payer: Medicare Other

## 2013-12-23 ENCOUNTER — Encounter (HOSPITAL_COMMUNITY): Payer: Self-pay | Admitting: Emergency Medicine

## 2013-12-23 ENCOUNTER — Inpatient Hospital Stay (HOSPITAL_COMMUNITY): Payer: Medicare Other

## 2013-12-23 DIAGNOSIS — I5033 Acute on chronic diastolic (congestive) heart failure: Principal | ICD-10-CM | POA: Diagnosis present

## 2013-12-23 DIAGNOSIS — J9600 Acute respiratory failure, unspecified whether with hypoxia or hypercapnia: Secondary | ICD-10-CM | POA: Diagnosis present

## 2013-12-23 DIAGNOSIS — H548 Legal blindness, as defined in USA: Secondary | ICD-10-CM | POA: Diagnosis present

## 2013-12-23 DIAGNOSIS — I10 Essential (primary) hypertension: Secondary | ICD-10-CM | POA: Diagnosis present

## 2013-12-23 DIAGNOSIS — F0390 Unspecified dementia without behavioral disturbance: Secondary | ICD-10-CM | POA: Diagnosis present

## 2013-12-23 DIAGNOSIS — J90 Pleural effusion, not elsewhere classified: Secondary | ICD-10-CM

## 2013-12-23 DIAGNOSIS — Z515 Encounter for palliative care: Secondary | ICD-10-CM

## 2013-12-23 DIAGNOSIS — J9601 Acute respiratory failure with hypoxia: Secondary | ICD-10-CM

## 2013-12-23 DIAGNOSIS — H409 Unspecified glaucoma: Secondary | ICD-10-CM | POA: Diagnosis present

## 2013-12-23 DIAGNOSIS — R627 Adult failure to thrive: Secondary | ICD-10-CM | POA: Diagnosis present

## 2013-12-23 DIAGNOSIS — G9341 Metabolic encephalopathy: Secondary | ICD-10-CM | POA: Diagnosis present

## 2013-12-23 DIAGNOSIS — I4891 Unspecified atrial fibrillation: Secondary | ICD-10-CM

## 2013-12-23 DIAGNOSIS — I482 Chronic atrial fibrillation, unspecified: Secondary | ICD-10-CM

## 2013-12-23 DIAGNOSIS — I509 Heart failure, unspecified: Secondary | ICD-10-CM | POA: Diagnosis present

## 2013-12-23 HISTORY — DX: Pneumonia, unspecified organism: J18.9

## 2013-12-23 HISTORY — DX: Malignant (primary) neoplasm, unspecified: C80.1

## 2013-12-23 HISTORY — DX: Unspecified atrial fibrillation: I48.91

## 2013-12-23 HISTORY — DX: Other ill-defined heart diseases: I51.89

## 2013-12-23 HISTORY — DX: Atherosclerotic heart disease of native coronary artery without angina pectoris: I25.10

## 2013-12-23 LAB — BASIC METABOLIC PANEL
BUN: 20 mg/dL (ref 6–23)
CHLORIDE: 98 meq/L (ref 96–112)
CO2: 35 mEq/L — ABNORMAL HIGH (ref 19–32)
Calcium: 8.9 mg/dL (ref 8.4–10.5)
Creatinine, Ser: 0.58 mg/dL (ref 0.50–1.10)
GFR calc non Af Amer: 79 mL/min — ABNORMAL LOW (ref >90)
Glucose, Bld: 115 mg/dL — ABNORMAL HIGH (ref 70–99)
POTASSIUM: 4.9 meq/L (ref 3.7–5.3)
SODIUM: 140 meq/L (ref 137–147)

## 2013-12-23 LAB — LACTIC ACID, PLASMA: Lactic Acid, Venous: 1.2 mmol/L (ref 0.5–2.2)

## 2013-12-23 LAB — APTT: APTT: 34 s (ref 24–37)

## 2013-12-23 LAB — CBC WITH DIFFERENTIAL/PLATELET
BASOS ABS: 0 10*3/uL (ref 0.0–0.1)
Basophils Relative: 0 % (ref 0–1)
Eosinophils Absolute: 0 10*3/uL (ref 0.0–0.7)
Eosinophils Relative: 0 % (ref 0–5)
HEMATOCRIT: 38.8 % (ref 36.0–46.0)
HEMOGLOBIN: 11.8 g/dL — AB (ref 12.0–15.0)
LYMPHS PCT: 8 % — AB (ref 12–46)
Lymphs Abs: 0.6 10*3/uL — ABNORMAL LOW (ref 0.7–4.0)
MCH: 28.8 pg (ref 26.0–34.0)
MCHC: 30.4 g/dL (ref 30.0–36.0)
MCV: 94.6 fL (ref 78.0–100.0)
MONO ABS: 0.3 10*3/uL (ref 0.1–1.0)
MONOS PCT: 5 % (ref 3–12)
NEUTROS ABS: 6.6 10*3/uL (ref 1.7–7.7)
NEUTROS PCT: 87 % — AB (ref 43–77)
Platelets: 267 10*3/uL (ref 150–400)
RBC: 4.1 MIL/uL (ref 3.87–5.11)
RDW: 16.1 % — AB (ref 11.5–15.5)
WBC: 7.6 10*3/uL (ref 4.0–10.5)

## 2013-12-23 LAB — URINALYSIS, ROUTINE W REFLEX MICROSCOPIC
Glucose, UA: NEGATIVE mg/dL
Hgb urine dipstick: NEGATIVE
Ketones, ur: NEGATIVE mg/dL
Leukocytes, UA: NEGATIVE
Nitrite: NEGATIVE
PROTEIN: 30 mg/dL — AB
UROBILINOGEN UA: 0.2 mg/dL (ref 0.0–1.0)
pH: 5.5 (ref 5.0–8.0)

## 2013-12-23 LAB — TROPONIN I: Troponin I: <0.3 ng/mL (ref <0.30)

## 2013-12-23 LAB — PRO B NATRIURETIC PEPTIDE: Pro B Natriuretic peptide (BNP): 8532 pg/mL — ABNORMAL HIGH (ref 0–450)

## 2013-12-23 LAB — PROTIME-INR
INR: 1.71 — AB (ref 0.00–1.49)
PROTHROMBIN TIME: 19.6 s — AB (ref 11.6–15.2)

## 2013-12-23 LAB — URINE MICROSCOPIC-ADD ON

## 2013-12-23 LAB — TSH: TSH: 3.68 u[IU]/mL (ref 0.350–4.500)

## 2013-12-23 MED ORDER — DIGOXIN 125 MCG PO TABS
125.0000 ug | ORAL_TABLET | Freq: Every day | ORAL | Status: DC
  Administered 2013-12-23 – 2013-12-25 (×3): 125 ug via ORAL
  Filled 2013-12-23 (×4): qty 1

## 2013-12-23 MED ORDER — SODIUM CHLORIDE 0.9 % IV SOLN: INTRAVENOUS | Status: DC

## 2013-12-23 MED ORDER — DILTIAZEM HCL ER COATED BEADS 240 MG PO CP24
240.0000 mg | ORAL_CAPSULE | Freq: Every day | ORAL | Status: DC
  Administered 2013-12-23 – 2013-12-25 (×3): 240 mg via ORAL
  Filled 2013-12-23 (×4): qty 1

## 2013-12-23 MED ORDER — SODIUM CHLORIDE 0.9 % IV SOLN: 250.0000 mL | INTRAVENOUS | Status: DC | PRN

## 2013-12-23 MED ORDER — FUROSEMIDE 10 MG/ML IJ SOLN
40.0000 mg | Freq: Two times a day (BID) | INTRAMUSCULAR | Status: DC
  Administered 2013-12-23 – 2013-12-26 (×6): 40 mg via INTRAVENOUS
  Filled 2013-12-23 (×6): qty 4

## 2013-12-23 MED ORDER — SODIUM CHLORIDE 0.9 % IJ SOLN
3.0000 mL | Freq: Two times a day (BID) | INTRAMUSCULAR | Status: DC
  Administered 2013-12-23 – 2013-12-26 (×6): 3 mL via INTRAVENOUS

## 2013-12-23 MED ORDER — FUROSEMIDE 10 MG/ML IJ SOLN
40.0000 mg | Freq: Once | INTRAMUSCULAR | Status: AC
  Administered 2013-12-23: 40 mg via INTRAVENOUS
  Filled 2013-12-23: qty 4

## 2013-12-23 MED ORDER — SODIUM CHLORIDE 0.9 % IJ SOLN: 3.0000 mL | INTRAMUSCULAR | Status: DC | PRN

## 2013-12-23 MED ORDER — APIXABAN 5 MG PO TABS
5.0000 mg | ORAL_TABLET | Freq: Two times a day (BID) | ORAL | Status: DC
  Administered 2013-12-23 – 2013-12-24 (×2): 5 mg via ORAL
  Filled 2013-12-23 (×2): qty 1

## 2013-12-23 MED ORDER — CLORAZEPATE DIPOTASSIUM 7.5 MG PO TABS
3.7500 mg | ORAL_TABLET | Freq: Three times a day (TID) | ORAL | Status: DC
  Administered 2013-12-23 – 2013-12-25 (×5): 3.75 mg via ORAL
  Filled 2013-12-23 (×7): qty 1

## 2013-12-23 NOTE — ED Provider Notes (Signed)
CSN: [id number]     Arrival date & time 12/23/13  1237 History   First MD Initiated Contact with Patient 12/23/13 1244     Chief Complaint  Patient presents with  . Weakness      HPI Pt was seen at 1250. Per pt and her family, c/o gradual onset and worsening of persistent generalized weakness since yesterday. EMS states pt's O2 Sats were "70's on R/A" on their arrival to scene. Pt's O2 Sats increased to low 90's after O2 N/C was applied. Pt herself only states "I feel tired." Denies CP/palpitations, no cough, no abd pain, no N/V/D, no back pain.     Past Medical History  Diagnosis Date  . Atrial fibrillation   . Pneumonia   . Diastolic dysfunction    History reviewed. No pertinent past surgical history.  History  Substance Use Topics  . Smoking status: Never Smoker   . Smokeless tobacco: Not on file  . Alcohol Use: No    Review of Systems ROS: Statement: All systems negative except as marked or noted in the HPI; Constitutional: Negative for fever and chills. +generalized weakness/fatigue.;; Eyes: Negative for eye pain, redness and discharge. ; ; ENMT: Negative for ear pain, hoarseness, nasal congestion, sinus pressure and sore throat. ; ; Cardiovascular: Negative for chest pain, palpitations, diaphoresis, dyspnea and peripheral edema. ; ; Respiratory: Negative for cough, wheezing and stridor. ; ; Gastrointestinal: Negative for nausea, vomiting, diarrhea, abdominal pain, blood in stool, hematemesis, jaundice and rectal bleeding. . ; ; Genitourinary: Negative for dysuria, flank pain and hematuria. ; ; Musculoskeletal: Negative for back pain and neck pain. Negative for swelling and trauma.; ; Skin: Negative for pruritus, rash, abrasions, blisters, bruising and skin lesion.; ; Neuro: Negative for headache, lightheadedness and neck stiffness. Negative for atered level of consciousness , altered mental status, extremity weakness, paresthesias, involuntary movement, seizure and syncope.      Allergies  Review of patient's allergies indicates no known allergies.  Home Medications   Prior to Admission medications   Medication Sig Start Date End Date Taking? Authorizing Provider  apixaban (ELIQUIS) 5 MG TABS tablet Take 5 mg by mouth 2 (two) times daily.   Yes Historical Provider, MD  clorazepate (TRANXENE) 3.75 MG tablet Take 1 tablet by mouth 3 (three) times daily. 12/05/13  Yes Historical Provider, MD  [place] 125 MCG tablet Take 1 tablet by mouth daily. 12/07/13  Yes Historical Provider, MD  diltiazem (CARDIZEM CD) 240 MG 24 hr capsule Take 1 capsule by mouth daily. 12/07/13  Yes Historical Provider, MD  diphenoxylate-atropine (LOMOTIL) 2.5-0.025 MG per tablet Take 1 tablet by mouth 4 (four) times daily as needed for diarrhea or loose stools.   Yes Historical Provider, MD  ipratropium (ATROVENT) 0.06 % nasal spray Place 1-2 sprays into both nostrils 4 (four) times daily as needed (runny nose.).  12/02/13  Yes Historical Provider, MD   BP 146/81  Pulse 39  Temp(Src) 98.2 F (36.8 C) (Rectal)  Resp 24  Ht 5' (1.524 m)  Wt 90 lb (40.824 kg)  BMI 17.58 kg/m2  SpO2 88% Physical Exam 1255: Physical examination:  Nursing notes reviewed; Vital signs and O2 SAT reviewed;  Constitutional: Thin, frail. In no acute distress; Head:  Normocephalic, atraumatic; Eyes: EOMI, PERRL, No scleral icterus; ENMT: Mouth and pharynx normal, Mucous membranes dry; Neck: Supple, Full range of motion, No lymphadenopathy; Cardiovascular: Irregular irregular rate and rhythm, No gallop; Respiratory: Breath sounds coarse & equal bilaterally, No wheezes.  Speaking full sentences  with ease, Normal respiratory effort/excursion; Chest: Nontender, Movement normal; Abdomen: Soft, Nontender, Nondistended, Normal bowel sounds; Genitourinary: No CVA tenderness; Extremities: Pulses normal, No tenderness, No edema, No calf edema or asymmetry.; Neuro: Awake, alert, vague historian. +HOH. No facial droop. Speech clear. Moves  all extremities on stretcher and to command without apparent gross focal motor deficits.; Skin: Color normal, Warm, Dry. +small area of erythema to coccyx, no open wounds.    ED Course  Procedures    EKG Interpretation   Date/Time:  Friday December 23 2013 12:47:08 EDT Ventricular Rate:  80 PR Interval:    QRS Duration: 86 QT Interval:  340 QTC Calculation: 392 R Axis:   60 Text Interpretation:  Atrial fibrillation Artifact Baseline wander  Nonspecific ST and T wave abnormality Abnormal ECG When compared with ECG  of 05-Mar-2009 12:09, Poor data quality in current ECG precludes serial  comparison Confirmed by [facility]  MD, Nunzio Cory [phone]) on 12/23/2013  2:03:42 PM      MDM  MDM Reviewed: previous chart, nursing note and vitals Reviewed previous: labs and ECG Interpretation: labs, ECG and x-ray    Results for orders placed during the hospital encounter of 12/23/13  CBC WITH DIFFERENTIAL      Result Value Ref Range   WBC 7.6  4.0 - 10.5 K/uL   RBC 4.10  3.87 - 5.11 MIL/uL   Hemoglobin 11.8 (*) 12.0 - 15.0 g/dL   HCT 38.8  36.0 - 46.0 %   MCV 94.6  78.0 - 100.0 fL   MCH 28.8  26.0 - 34.0 pg   MCHC 30.4  30.0 - 36.0 g/dL   RDW 16.1 (*) 11.5 - 15.5 %   Platelets 267  150 - 400 K/uL   Neutrophils Relative % 87 (*) 43 - 77 %   Neutro Abs 6.6  1.7 - 7.7 K/uL   Lymphocytes Relative 8 (*) 12 - 46 %   Lymphs Abs 0.6 (*) 0.7 - 4.0 K/uL   Monocytes Relative 5  3 - 12 %   Monocytes Absolute 0.3  0.1 - 1.0 K/uL   Eosinophils Relative 0  0 - 5 %   Eosinophils Absolute 0.0  0.0 - 0.7 K/uL   Basophils Relative 0  0 - 1 %   Basophils Absolute 0.0  0.0 - 0.1 K/uL  TROPONIN I      Result Value Ref Range   Troponin I <0.30  <0.30 ng/mL  BASIC METABOLIC PANEL      Result Value Ref Range   Sodium 140  137 - 147 mEq/L   Potassium 4.9  3.7 - 5.3 mEq/L   Chloride 98  96 - 112 mEq/L   CO2 35 (*) 19 - 32 mEq/L   Glucose, Bld 115 (*) 70 - 99 mg/dL   BUN 20  6 - 23 mg/dL   Creatinine,  Ser 0.58  0.50 - 1.10 mg/dL   Calcium 8.9  8.4 - 10.5 mg/dL   GFR calc non Af Amer 79 (*) >90 mL/min   GFR calc Af Amer >90  >90 mL/min  APTT      Result Value Ref Range   aPTT 34  24 - 37 seconds  PROTIME-INR      Result Value Ref Range   Prothrombin Time 19.6 (*) 11.6 - 15.2 seconds   INR 1.71 (*) 0.00 - 1.49  LACTIC ACID, PLASMA      Result Value Ref Range   Lactic Acid, Venous 1.2  0.5 - 2.2 mmol/L  URINALYSIS, ROUTINE W REFLEX MICROSCOPIC      Result Value Ref Range   Color, Urine YELLOW  YELLOW   APPearance CLEAR  CLEAR   Specific Gravity, Urine >1.030 (*) 1.005 - 1.030   pH 5.5  5.0 - 8.0   Glucose, UA NEGATIVE  NEGATIVE mg/dL   Hgb urine dipstick NEGATIVE  NEGATIVE   Bilirubin Urine SMALL (*) NEGATIVE   Ketones, ur NEGATIVE  NEGATIVE mg/dL   Protein, ur 30 (*) NEGATIVE mg/dL   Urobilinogen, UA 0.2  0.0 - 1.0 mg/dL   Nitrite NEGATIVE  NEGATIVE   Leukocytes, UA NEGATIVE  NEGATIVE  PRO B NATRIURETIC PEPTIDE      Result Value Ref Range   Pro B Natriuretic peptide (BNP) 8532.0 (*) 0 - 450 pg/mL  URINE MICROSCOPIC-ADD ON      Result Value Ref Range   Squamous Epithelial / LPF RARE  RARE   WBC, UA 0-2  <3 WBC/hpf   RBC / HPF 0-2  <3 RBC/hpf   Dg Chest Portable 1 View 12/23/2013   CLINICAL DATA:  WEAKNESS WEAKNESS  EXAM: PORTABLE CHEST - 1 VIEW  FINDINGS: Diffuse increased density within the lung bases. There is prominence of interstitial markings, right greater than left. Bilateral pleural effusions right greater than left. The bones are osteopenic. No acute osseous abnormalities. The cardiac silhouette is obscured. Aorta is tortuous and ectatic. Atherosclerotic calcifications are appreciated within the arch the aorta.  IMPRESSION: Interstitial prominence likely reflecting a component pulmonary edema.  Atelectasis versus infiltrate within the lung bases right greater than left.  Bilateral effusions moderate on the right small to moderate on the left.   Electronically Signed    By: Margaree Mackintosh M.D.   On: 12/23/2013 13:21    1450:  BNP elevated from previous with pulmonary edema on CXR; will dose IV lasix. Dx and testing d/w pt and family.  Questions answered.  Verb understanding, agreeable to admit.  T/C to [facility] Dr. Carles Collet, case discussed, including:  HPI, pertinent PM/SHx, VS/PE, dx testing, ED course and treatment:  Agreeable to admit, requests to write temporary orders, obtain tele bed to Dr. Luan Pulling' service.    Alfonzo Feller, DO 12/25/13 [phone]

## 2013-12-23 NOTE — ED Notes (Signed)
Attempted to call report, was told that CN had not assigned the room to a nurse yet and someone will call back, room has been assigned for at least 15 min.

## 2013-12-23 NOTE — H&P (Signed)
History and Physical  Brianna Parker LNL:[id number] DOB: 04/07/1922 DOA: 12/23/2013   PCP: Alonza Bogus, MD   Chief Complaint: Generalized weakness  HPI:  80 year old female with history of hypertension, chronic atrial fibrillation, spinal stenosis, and glaucoma presents with a one-week history of progressive shortness of breath. The patient is a poor historian. All of this history is obtained from speaking with the patient's family at the bedside. The patient has had worsening dyspnea on exertion progressing to the point of having dyspnea at rest. On the morning of admission, the patient's son came to visit her and noted that she was weaker than usual and was not able to get up out of bed. As a result, EMS was activated. The patient normally is able to ambulate with a cane. The patient lives alone, but her family visits her daily to check up on her. There has not been any reports of fevers, chills, chest pain, nausea, vomiting, diarrhea, abdominal pain pain, dysuria, hematuria, hematochezia, melena.  The patient's daughter states that the patient has more garbled/gutteral speech for the past week but has not noted focal extremity weakness.  The patient is legally blind. EMS noted that the patient had oxygen saturation in the 70s on room air. The patient was placed on 2 L with oxygen saturations improving to the mid 90s. In the ED, the patient was given furosemide 40 mg IV. BMP was unremarkable except for potassium 4.9. CBC was unremarkable except for hemoglobin 12.8. Her BNP was 8532. Urinalysis negative for pyuria. INR was 1.71. Troponin was negative, lactic acid 1.2, EKG showed atrial fibrillation with nonspecific ST changes. Chest x-ray showed bilateral pleural effusion, R>L, with prominent interstitial markings  Assessment/Plan: Acute on chronic diastolic CHF -40/81/4481 shows EF 85-63%, grade 2 diastolic dysfunction -The patient has consented to Foley catheter for closer I/O  Monitoring -Fluid restriction -Furosemide 40 mg IV twice a day -Daily weights -Echocardiogram Acute hypoxemic respiratory failure -supplement oxygen, wean as tolerated -secondary to CHF decompensation Chronic atrial fibrillation -Rate controlled -Continue Cardizem CD 240 mg daily -Continue apixiban Garbled/gutteral speech ?dysarthria -One daughter and one son had noted this, but the other son did not feel that the patient's speech has changed -CT brain without contrast -No focal extremity weakness Hypertension Continue Cardizem CD       Past Medical History  Diagnosis Date  . Atrial fibrillation   . Pneumonia   . Diastolic dysfunction    History reviewed. No pertinent past surgical history. Social History:  reports that she has never smoked. She does not have any smokeless tobacco history on file. She reports that she does not drink alcohol. Her drug history is not on file.   History reviewed. No pertinent family history.   No Known Allergies    Prior to Admission medications   Medication Sig Start Date End Date Taking? Authorizing Provider  apixaban (ELIQUIS) 5 MG TABS tablet Take 5 mg by mouth 2 (two) times daily.   Yes Historical Provider, MD  clorazepate (TRANXENE) 3.75 MG tablet Take 1 tablet by mouth 3 (three) times daily. 12/05/13  Yes Historical Provider, MD  [place] 125 MCG tablet Take 1 tablet by mouth daily. 12/07/13  Yes Historical Provider, MD  diltiazem (CARDIZEM CD) 240 MG 24 hr capsule Take 1 capsule by mouth daily. 12/07/13  Yes Historical Provider, MD  diphenoxylate-atropine (LOMOTIL) 2.5-0.025 MG per tablet Take 1 tablet by mouth 4 (four) times daily as needed for diarrhea or loose stools.  Yes Historical Provider, MD  ipratropium (ATROVENT) 0.06 % nasal spray Place 1-2 sprays into both nostrils 4 (four) times daily as needed (runny nose.).  12/02/13  Yes Historical Provider, MD    Review of Systems:  Constitutional:  No weight loss, night sweats,  Fevers, chills, fatigue.  Head&Eyes: No headache.  No vision loss.  No eye pain or scotoma ENT:  No Difficulty swallowing,Tooth/dental problems,Sore throat,  No ear ache, post nasal drip,  Cardio-vascular:  No chest pain, Orthopnea, PND dizziness, palpitations  GI:  No  abdominal pain, nausea, vomiting, diarrhea,hematochezia, melena, heartburn, indigestion, Resp:  . No cough. No coughing up of blood .No wheezing.No chest wall deformity  Skin:  no rash or lesions.  GU:  no dysuria, change in color of urine, no urgency or frequency. No flank pain.  Musculoskeletal:  No joint pain or swelling. No decreased range of motion. No back pain.  Psych:  No change in mood or affect.  Neurologic: No headache, no dysesthesia, no focal weakness, no vision loss. No syncope  Physical Exam: Filed Vitals:   12/23/13 1434 12/23/13 1500 12/23/13 1531 12/23/13 1533  BP: 146/81 128/82    Pulse: 39 93 77 86  Temp:      TempSrc:      Resp: 24 18 19 18   Height:      Weight:      SpO2: 88% 77% 92% 92%   General:  A&O x 2, NAD, nontoxic, pleasant/cooperative Head/Eye: No conjunctival hemorrhage, no icterus, [place]/AT, No nystagmus ENT:  No icterus,  No thrush,  no pharyngeal exudate Neck:  No masses, no lymphadenpathy, no bruits CV:  IRRR, no rub, no gallop Lung:  Bilateral crackles, right greater than left. No wheezing Abdomen: soft/NT, +BS, nondistended, no peritoneal signs Ext: No cyanosis, No rashes, No petechiae, No lymphangitis, 2+LE edema Neuro: CNII-XII intact, strength 4/5 in bilateral upper and lower extremities, no dysmetria  Labs on Admission:  Basic Metabolic Panel:  Recent Labs Lab 12/23/13 1305  NA 140  K 4.9  CL 98  CO2 35*  GLUCOSE 115*  BUN 20  CREATININE 0.58  CALCIUM 8.9   Liver Function Tests: No results found for this basename: AST, ALT, ALKPHOS, BILITOT, PROT, ALBUMIN,  in the last 168 hours No results found for this basename: LIPASE, AMYLASE,  in the last 168  hours No results found for this basename: AMMONIA,  in the last 168 hours CBC:  Recent Labs Lab 12/23/13 1305  WBC 7.6  NEUTROABS 6.6  HGB 11.8*  HCT 38.8  MCV 94.6  PLT 267   Cardiac Enzymes:  Recent Labs Lab 12/23/13 1305  TROPONINI <0.30   BNP: No components found with this basename: POCBNP,  CBG: No results found for this basename: GLUCAP,  in the last 168 hours  Radiological Exams on Admission: Dg Chest Portable 1 View  12/23/2013   CLINICAL DATA:  WEAKNESS WEAKNESS  EXAM: PORTABLE CHEST - 1 VIEW  FINDINGS: Diffuse increased density within the lung bases. There is prominence of interstitial markings, right greater than left. Bilateral pleural effusions right greater than left. The bones are osteopenic. No acute osseous abnormalities. The cardiac silhouette is obscured. Aorta is tortuous and ectatic. Atherosclerotic calcifications are appreciated within the arch the aorta.  IMPRESSION: Interstitial prominence likely reflecting a component pulmonary edema.  Atelectasis versus infiltrate within the lung bases right greater than left.  Bilateral effusions moderate on the right small to moderate on the left.   Electronically Signed  By: Margaree Mackintosh M.D.   On: 12/23/2013 13:21    EKG: Independently reviewed. Atrial fibrillation, nonspecific ST changes    Time spent:70 minutes Code Status:   FULL Family Communication:   Sons and daughter at [place], DO  [facility] Pager [phone]  If 7PM-7AM, please contact night-coverage [email or web address] Password [facility] 12/23/2013, 3:57 PM

## 2013-12-23 NOTE — ED Notes (Signed)
Pt lives with family, well taken care of by family per EMS, the pt has been weak since yesterday per family, O2 sats in the 70's on RA per EMS, pt responded to 2L [place] per EMS.

## 2013-12-23 NOTE — Progress Notes (Signed)
Ns not started. Do not hang per Dr Tat. Patient getting lasix.

## 2013-12-24 DIAGNOSIS — I369 Nonrheumatic tricuspid valve disorder, unspecified: Secondary | ICD-10-CM

## 2013-12-24 LAB — BASIC METABOLIC PANEL
BUN: 17 mg/dL (ref 6–23)
CHLORIDE: 94 meq/L — AB (ref 96–112)
CO2: 40 meq/L — AB (ref 19–32)
Calcium: 8.3 mg/dL — ABNORMAL LOW (ref 8.4–10.5)
Creatinine, Ser: 0.61 mg/dL (ref 0.50–1.10)
GFR calc Af Amer: >90 mL/min (ref >90)
GFR calc non Af Amer: 78 mL/min — ABNORMAL LOW (ref >90)
Glucose, Bld: 91 mg/dL (ref 70–99)
POTASSIUM: 3.6 meq/L — AB (ref 3.7–5.3)
Sodium: 141 mEq/L (ref 137–147)

## 2013-12-24 MED ORDER — LORAZEPAM 2 MG/ML IJ SOLN
1.0000 mg | Freq: Once | INTRAMUSCULAR | Status: AC
  Administered 2013-12-24: 1 mg via INTRAMUSCULAR
  Filled 2013-12-24: qty 1

## 2013-12-24 MED ORDER — APIXABAN 5 MG PO TABS
2.5000 mg | ORAL_TABLET | Freq: Two times a day (BID) | ORAL | Status: DC
  Administered 2013-12-25: 2.5 mg via ORAL
  Filled 2013-12-24 (×3): qty 1

## 2013-12-24 MED ORDER — LORAZEPAM 1 MG PO TABS: 1.0000 mg | ORAL_TABLET | Freq: Once | ORAL | Status: AC

## 2013-12-24 NOTE — Progress Notes (Signed)
Text paged hospitalist CO2 40 per lab, await return call.

## 2013-12-24 NOTE — Progress Notes (Signed)
Utilization review Completed Deandrew Hoecker RN BSN   

## 2013-12-24 NOTE — Progress Notes (Signed)
Subjective: She was admitted yesterday with generalized weakness and congestive heart failure. She has baseline dementia and is legally blind.  Objective: Vital signs in last 24 hours: Temp:  [97.6 F (36.4 C)-98.2 F (36.8 C)] 98 F (36.7 C) (04/18 0630) Pulse Rate:  [39-93] 51 (04/18 0630) Resp:  [16-35] 18 (04/18 0630) BP: (99-155)/(49-94) 99/49 mmHg (04/18 0630) SpO2:  [77 %-94 %] 94 % (04/18 0630) Weight:  [40.824 kg (90 lb)-44.498 kg (98 lb 1.6 oz)] 44.498 kg (98 lb 1.6 oz) (04/17 1709) Weight change:  Last BM Date: 12/22/13  Intake/Output from previous day: 04/17 0701 - 04/18 0700 In: 620 [P.O.:120; I.V.:500] Out: 1810 [Urine:1810]  PHYSICAL EXAM General appearance: alert and mild distress Resp: clear to auscultation bilaterally Cardio: irregularly irregular rhythm GI: soft, non-tender; bowel sounds normal; no masses,  no organomegaly Extremities: extremities normal, atraumatic, no cyanosis or edema  Lab Results:    Basic Metabolic Panel:  Recent Labs  12/23/13 1305 12/24/13 0440  NA 140 141  K 4.9 3.6*  CL 98 94*  CO2 35* 40*  GLUCOSE 115* 91  BUN 20 17  CREATININE 0.58 0.61  CALCIUM 8.9 8.3*   Liver Function Tests: No results found for this basename: AST, ALT, ALKPHOS, BILITOT, PROT, ALBUMIN,  in the last 72 hours No results found for this basename: LIPASE, AMYLASE,  in the last 72 hours No results found for this basename: AMMONIA,  in the last 72 hours CBC:  Recent Labs  12/23/13 1305  WBC 7.6  NEUTROABS 6.6  HGB 11.8*  HCT 38.8  MCV 94.6  PLT 267   Cardiac Enzymes:  Recent Labs  12/23/13 1305  TROPONINI <0.30   BNP:  Recent Labs  12/23/13 1328  PROBNP 8532.0*   D-Dimer: No results found for this basename: DDIMER,  in the last 72 hours CBG: No results found for this basename: GLUCAP,  in the last 72 hours Hemoglobin A1C: No results found for this basename: HGBA1C,  in the last 72 hours Fasting Lipid Panel: No results found  for this basename: CHOL, HDL, LDLCALC, TRIG, CHOLHDL, LDLDIRECT,  in the last 72 hours Thyroid Function Tests:  Recent Labs  12/23/13 1715  TSH 3.680   Anemia Panel: No results found for this basename: VITAMINB12, FOLATE, FERRITIN, TIBC, IRON, RETICCTPCT,  in the last 72 hours Coagulation:  Recent Labs  12/23/13 1305  LABPROT 19.6*  INR 1.71*   Urine Drug Screen: Drugs of Abuse  No results found for this basename: labopia, cocainscrnur, labbenz, amphetmu, thcu, labbarb    Alcohol Level: No results found for this basename: ETH,  in the last 72 hours Urinalysis:  Recent Labs  12/23/13 1311  COLORURINE YELLOW  LABSPEC >1.030*  PHURINE 5.5  GLUCOSEU NEGATIVE  HGBUR NEGATIVE  BILIRUBINUR SMALL*  KETONESUR NEGATIVE  PROTEINUR 30*  UROBILINOGEN 0.2  NITRITE NEGATIVE  LEUKOCYTESUR NEGATIVE   Misc. Labs:  ABGS No results found for this basename: PHART, PCO2, PO2ART, TCO2, HCO3,  in the last 72 hours CULTURES No results found for this or any previous visit (from the past 240 hour(s)). Studies/Results: Ct Head Wo Contrast  12/23/2013   CLINICAL DATA:  Garbled speech.  Legally blind.  EXAM: CT HEAD WITHOUT CONTRAST  TECHNIQUE: Contiguous axial images were obtained from the base of the skull through the vertex without contrast.  COMPARISON:  01/23/2010.  FINDINGS: No evidence for acute infarction, hemorrhage, mass lesion, hydrocephalus, or extra-axial fluid. Moderate cerebral and cerebellar atrophy. Hypoattenuation white matter consistent with  small vessel disease. Calvarium intact. Hyperostosis. No sinus or mastoid disease. Dense left lenticular opacity. Right cataract extraction.  IMPRESSION: Atrophy and small vessel disease. No acute intracranial findings. No acute cortical infarct is evident.   Electronically Signed   By: Rolla Flatten M.D.   On: 12/23/2013 18:19   Dg Chest Portable 1 View  12/23/2013   CLINICAL DATA:  WEAKNESS WEAKNESS  EXAM: PORTABLE CHEST - 1 VIEW   FINDINGS: Diffuse increased density within the lung bases. There is prominence of interstitial markings, right greater than left. Bilateral pleural effusions right greater than left. The bones are osteopenic. No acute osseous abnormalities. The cardiac silhouette is obscured. Aorta is tortuous and ectatic. Atherosclerotic calcifications are appreciated within the arch the aorta.  IMPRESSION: Interstitial prominence likely reflecting a component pulmonary edema.  Atelectasis versus infiltrate within the lung bases right greater than left.  Bilateral effusions moderate on the right small to moderate on the left.   Electronically Signed   By: Margaree Mackintosh M.D.   On: 12/23/2013 13:21    Medications:  Prior to Admission:  Prescriptions prior to admission  Medication Sig Dispense Refill  . apixaban (ELIQUIS) 5 MG TABS tablet Take 5 mg by mouth 2 (two) times daily.      . clorazepate (TRANXENE) 3.75 MG tablet Take 1 tablet by mouth 3 (three) times daily.      Marland Kitchen DIGOX 125 MCG tablet Take 1 tablet by mouth daily.      Marland Kitchen diltiazem (CARDIZEM CD) 240 MG 24 hr capsule Take 1 capsule by mouth daily.      . diphenoxylate-atropine (LOMOTIL) 2.5-0.025 MG per tablet Take 1 tablet by mouth 4 (four) times daily as needed for diarrhea or loose stools.      Marland Kitchen ipratropium (ATROVENT) 0.06 % nasal spray Place 1-2 sprays into both nostrils 4 (four) times daily as needed (runny nose.).        Scheduled: . apixaban  5 mg Oral BID  . clorazepate  3.75 mg Oral TID  . digoxin  125 mcg Oral Daily  . diltiazem  240 mg Oral Daily  . furosemide  40 mg Intravenous BID  . sodium chloride  3 mL Intravenous Q12H   Continuous:  TOI:ZTIWPY chloride, sodium chloride  Assesment: She has acute on chronic diastolic CHF and that seems a little better. She had acute respiratory failure and was hypoxic and that also seems a little better. She has hypertension which is pretty well controlled. She has chronic atrial fibrillation Active  Problems:   CHF, acute on chronic   Acute on chronic diastolic congestive heart failure   Acute respiratory failure with hypoxia   Chronic atrial fibrillation   Unspecified essential hypertension    Plan: She is to have echocardiogram. Continue other treatments.    LOS: 1 day   Alonza Bogus 12/24/2013, 9:31 AM

## 2013-12-25 LAB — BASIC METABOLIC PANEL
BUN: 17 mg/dL (ref 6–23)
CHLORIDE: 92 meq/L — AB (ref 96–112)
Calcium: 8.6 mg/dL (ref 8.4–10.5)
Creatinine, Ser: 0.61 mg/dL (ref 0.50–1.10)
GFR calc non Af Amer: 78 mL/min — ABNORMAL LOW (ref >90)
Glucose, Bld: 114 mg/dL — ABNORMAL HIGH (ref 70–99)
Potassium: 3.3 mEq/L — ABNORMAL LOW (ref 3.7–5.3)
Sodium: 142 mEq/L (ref 137–147)

## 2013-12-25 LAB — URINE CULTURE
COLONY COUNT: NO GROWTH
Culture: NO GROWTH

## 2013-12-25 MED ORDER — ALBUTEROL SULFATE (2.5 MG/3ML) 0.083% IN NEBU: 2.5000 mg | INHALATION_SOLUTION | RESPIRATORY_TRACT | Status: DC | PRN

## 2013-12-25 MED ORDER — ALBUTEROL SULFATE (2.5 MG/3ML) 0.083% IN NEBU
INHALATION_SOLUTION | RESPIRATORY_TRACT | Status: AC
  Administered 2013-12-25: 2.5 mg
  Filled 2013-12-25: qty 3

## 2013-12-25 MED ORDER — POTASSIUM CHLORIDE CRYS ER 20 MEQ PO TBCR
20.0000 meq | EXTENDED_RELEASE_TABLET | Freq: Two times a day (BID) | ORAL | Status: DC
  Administered 2013-12-25: 20 meq via ORAL
  Filled 2013-12-25 (×2): qty 1

## 2013-12-25 NOTE — Progress Notes (Signed)
CRITICAL VALUE ALERT  Critical value received:  CO2 >45  Date of notification:  12/25/2013  Time of notification:  0730  Critical value read back:yes  Nurse who received alert:  Leroy Kennedy RN  MD notified (1st page):  Dr Luan Pulling  Time of first page:  0735  MD notified (2nd page):  Time of second page:  Responding MD:  Luan Pulling  Time MD responded:  Giancarlo.Lions MD on floor

## 2013-12-25 NOTE — Progress Notes (Signed)
Subjective: She had a lot of agitation last night. She required medication for this and is sleepy this morning. She has some confusion at baseline.  Objective: Vital signs in last 24 hours: Temp:  [97.6 F (36.4 C)-98.1 F (36.7 C)] 98.1 F (36.7 C) (04/18 2213) Pulse Rate:  [66-68] 68 (04/18 2213) Resp:  [18] 18 (04/18 2213) BP: (100-108)/(58) 108/58 mmHg (04/18 2213) SpO2:  [96 %-99 %] 96 % (04/18 2213) Weight:  [42.774 kg (94 lb 4.8 oz)-44.09 kg (97 lb 3.2 oz)] 42.774 kg (94 lb 4.8 oz) (04/19 0447) Weight change: 3.266 kg (7 lb 3.2 oz) Last BM Date: 12/22/13  Intake/Output from previous day: 04/18 0701 - 04/19 0700 In: 240 [P.O.:240] Out: 600 [Urine:600]  PHYSICAL EXAM General appearance: alert, mild distress and Confused Resp: rhonchi bilaterally Cardio: irregularly irregular rhythm GI: soft, non-tender; bowel sounds normal; no masses,  no organomegaly Extremities: Unchanged  Lab Results:    Basic Metabolic Panel:  Recent Labs  12/24/13 0440 12/25/13 0605  NA 141 142  K 3.6* 3.3*  CL 94* 92*  CO2 40* >45*  GLUCOSE 91 114*  BUN 17 17  CREATININE 0.61 0.61  CALCIUM 8.3* 8.6   Liver Function Tests: No results found for this basename: AST, ALT, ALKPHOS, BILITOT, PROT, ALBUMIN,  in the last 72 hours No results found for this basename: LIPASE, AMYLASE,  in the last 72 hours No results found for this basename: AMMONIA,  in the last 72 hours CBC:  Recent Labs  12/23/13 1305  WBC 7.6  NEUTROABS 6.6  HGB 11.8*  HCT 38.8  MCV 94.6  PLT 267   Cardiac Enzymes:  Recent Labs  12/23/13 1305  TROPONINI <0.30   BNP:  Recent Labs  12/23/13 1328  PROBNP 8532.0*   D-Dimer: No results found for this basename: DDIMER,  in the last 72 hours CBG: No results found for this basename: GLUCAP,  in the last 72 hours Hemoglobin A1C: No results found for this basename: HGBA1C,  in the last 72 hours Fasting Lipid Panel: No results found for this basename: CHOL,  HDL, LDLCALC, TRIG, CHOLHDL, LDLDIRECT,  in the last 72 hours Thyroid Function Tests:  Recent Labs  12/23/13 1715  TSH 3.680   Anemia Panel: No results found for this basename: VITAMINB12, FOLATE, FERRITIN, TIBC, IRON, RETICCTPCT,  in the last 72 hours Coagulation:  Recent Labs  12/23/13 1305  LABPROT 19.6*  INR 1.71*   Urine Drug Screen: Drugs of Abuse  No results found for this basename: labopia, cocainscrnur, labbenz, amphetmu, thcu, labbarb    Alcohol Level: No results found for this basename: ETH,  in the last 72 hours Urinalysis:  Recent Labs  12/23/13 1311  COLORURINE YELLOW  LABSPEC >1.030*  PHURINE 5.5  GLUCOSEU NEGATIVE  HGBUR NEGATIVE  BILIRUBINUR SMALL*  KETONESUR NEGATIVE  PROTEINUR 30*  UROBILINOGEN 0.2  NITRITE NEGATIVE  LEUKOCYTESUR NEGATIVE   Misc. Labs:  ABGS No results found for this basename: PHART, PCO2, PO2ART, TCO2, HCO3,  in the last 72 hours CULTURES Recent Results (from the past 240 hour(s))  URINE CULTURE     Status: None   Collection Time    12/23/13  1:10 PM      Result Value Ref Range Status   Specimen Description URINE, CATHETERIZED   Final   Special Requests NONE   Final   Culture  Setup Time     Final   Value: 12/24/2013 02:40     Performed at [facility]  [place] Count     Final   Value: NO GROWTH     Performed at [facility]   Culture     Final   Value: NO GROWTH     Performed at [facility]   Report Status 12/25/2013 FINAL   Final   Studies/Results: Ct Head Wo Contrast  12/23/2013   CLINICAL DATA:  Garbled speech.  Legally blind.  EXAM: CT HEAD WITHOUT CONTRAST  TECHNIQUE: Contiguous axial images were obtained from the base of the skull through the vertex without contrast.  COMPARISON:  01/23/2010.  FINDINGS: No evidence for acute infarction, hemorrhage, mass lesion, hydrocephalus, or extra-axial fluid. Moderate cerebral and cerebellar atrophy. Hypoattenuation white matter consistent with  small vessel disease. Calvarium intact. Hyperostosis. No sinus or mastoid disease. Dense left lenticular opacity. Right cataract extraction.  IMPRESSION: Atrophy and small vessel disease. No acute intracranial findings. No acute cortical infarct is evident.   Electronically Signed   By: Rolla Flatten M.D.   On: 12/23/2013 18:19   Dg Chest Portable 1 View  12/23/2013   CLINICAL DATA:  WEAKNESS WEAKNESS  EXAM: PORTABLE CHEST - 1 VIEW  FINDINGS: Diffuse increased density within the lung bases. There is prominence of interstitial markings, right greater than left. Bilateral pleural effusions right greater than left. The bones are osteopenic. No acute osseous abnormalities. The cardiac silhouette is obscured. Aorta is tortuous and ectatic. Atherosclerotic calcifications are appreciated within the arch the aorta.  IMPRESSION: Interstitial prominence likely reflecting a component pulmonary edema.  Atelectasis versus infiltrate within the lung bases right greater than left.  Bilateral effusions moderate on the right small to moderate on the left.   Electronically Signed   By: Margaree Mackintosh M.D.   On: 12/23/2013 13:21    Medications:  Prior to Admission:  Prescriptions prior to admission  Medication Sig Dispense Refill  . apixaban (ELIQUIS) 5 MG TABS tablet Take 5 mg by mouth 2 (two) times daily.      . clorazepate (TRANXENE) 3.75 MG tablet Take 1 tablet by mouth 3 (three) times daily.      Marland Kitchen DIGOX 125 MCG tablet Take 1 tablet by mouth daily.      Marland Kitchen diltiazem (CARDIZEM CD) 240 MG 24 hr capsule Take 1 capsule by mouth daily.      . diphenoxylate-atropine (LOMOTIL) 2.5-0.025 MG per tablet Take 1 tablet by mouth 4 (four) times daily as needed for diarrhea or loose stools.      Marland Kitchen ipratropium (ATROVENT) 0.06 % nasal spray Place 1-2 sprays into both nostrils 4 (four) times daily as needed (runny nose.).        Scheduled: . apixaban  2.5 mg Oral BID  . clorazepate  3.75 mg Oral TID  . digoxin  125 mcg Oral Daily   . diltiazem  240 mg Oral Daily  . furosemide  40 mg Intravenous BID  . potassium chloride  20 mEq Oral BID  . sodium chloride  3 mL Intravenous Q12H   Continuous:  OZD:GUYQIH chloride, sodium chloride  Assesment: She was admitted with acute on chronic diastolic CHF. She had acute respiratory failure with hypoxia. She has chronic atrial fibrillation. She has baseline dementia. She has been much more confused and agitated Active Problems:   CHF, acute on chronic   Acute on chronic diastolic congestive heart failure   Acute respiratory failure with hypoxia   Chronic atrial fibrillation   Unspecified essential hypertension    Plan: Continue treatments. I think she may  require nursing home placement    LOS: 2 days   Alonza Bogus 12/25/2013, 8:32 AM

## 2013-12-25 NOTE — Progress Notes (Addendum)
Brianna Parker , aspirated a small portion of soup family was giving her. Resp called non emergency tried to yanker suction her. She has very little cough or gag reflex. Did ntsx PTt trying to stimulate cough never passed in to lungs as she has no gag or cough reflex. Very unfortunate as this will most likely be  Troublesome and on going. Swallow EVAL if not done is needed. Albuterol is ordered PRN.

## 2013-12-25 NOTE — Progress Notes (Signed)
Pt not eating very well and refusing to take medication.  Attempted to try to put pills in pudding and pt will eat the pudding and then spit the pills out.  Family wishes to discuss switching medication to IV route or liquid form and wishes to discuss this with PCP.

## 2013-12-26 LAB — BASIC METABOLIC PANEL
BUN: 14 mg/dL (ref 6–23)
Calcium: 9 mg/dL (ref 8.4–10.5)
Chloride: 84 mEq/L — ABNORMAL LOW (ref 96–112)
Creatinine, Ser: 0.55 mg/dL (ref 0.50–1.10)
GFR calc Af Amer: >90 mL/min (ref >90)
GFR calc non Af Amer: 81 mL/min — ABNORMAL LOW (ref >90)
GLUCOSE: 131 mg/dL — AB (ref 70–99)
POTASSIUM: 2.9 meq/L — AB (ref 3.7–5.3)
SODIUM: 145 meq/L (ref 137–147)

## 2013-12-26 MED ORDER — CHLORHEXIDINE GLUCONATE 0.12 % MT SOLN
15.0000 mL | Freq: Two times a day (BID) | OROMUCOSAL | Status: DC
  Administered 2013-12-26 – 2013-12-27 (×2): 15 mL via OROMUCOSAL
  Filled 2013-12-26 (×2): qty 15

## 2013-12-26 MED ORDER — MORPHINE SULFATE 2 MG/ML IJ SOLN: 2.0000 mg | INTRAMUSCULAR | Status: DC | PRN

## 2013-12-26 MED ORDER — CHLORHEXIDINE GLUCONATE 0.12 % MT SOLN: 15.0000 mL | Freq: Two times a day (BID) | OROMUCOSAL | Status: DC

## 2013-12-26 MED ORDER — SODIUM CHLORIDE 0.9 % IV SOLN: INTRAVENOUS | Status: DC | PRN

## 2013-12-26 MED ORDER — BIOTENE DRY MOUTH MT LIQD
15.0000 mL | Freq: Two times a day (BID) | OROMUCOSAL | Status: DC
  Administered 2013-12-26 – 2013-12-27 (×3): 15 mL via OROMUCOSAL

## 2013-12-26 MED ORDER — BIOTENE DRY MOUTH MT LIQD: 15.0000 mL | OROMUCOSAL | Status: DC | PRN

## 2013-12-26 NOTE — Progress Notes (Signed)
Critical lab values called for K+ of 2.9 and CO2 >45. Will notify Dr. Luan Pulling as he arrives to floor. Will continue to monitor.

## 2013-12-26 NOTE — Progress Notes (Signed)
Patient deconditioning. Dr. Luan Pulling in to see patient and family. Patient unable to swallow medications. Dr. Luan Pulling notified, new orders to discontinue IV lasix and all PO medication, change diet to NPO. Will continue to monitor.

## 2013-12-26 NOTE — Progress Notes (Signed)
Patient's family concerned about patient's pain. Dr. Luan Pulling notified. New order for Morphine 2mg  q2hrs PRN. Family notified of order, does not want patient to have Morphine at this time.

## 2013-12-27 NOTE — Clinical Social Work Note (Signed)
Patient accepted by [facility], can transfer via EMS.  Paperwork prepared, left w shadow chart.  MD informed.  Edwyna Shell, LCSW Clinical Social Worker [phone])

## 2013-12-27 NOTE — Care Management Note (Signed)
    Page 1 of 1   12/27/2013     1:06:04 PM CARE MANAGEMENT NOTE 12/27/2013  Patient:  Brianna Parker, Brianna Parker   Account Number:  [id number]  Date Initiated:  12/27/2013  Documentation initiated by:  Claretha Cooper  Subjective/Objective Assessment:   Pt was admitted from home where she lives alone with close family support. Pt to be discharged to [facility].     Action/Plan:   Anticipated DC Date:  12/27/2013   Anticipated DC Plan:  [place]  In-house referral  Clinical Social Worker      DC Planning Services  CM consult      Choice offered to / List presented to:             Status of service:  Completed, signed off Medicare Important Message given?  YES (If response is "NO", the following Medicare IM given date fields will be blank) Date Medicare IM given:  12/27/2013 Date Additional Medicare IM given:    Discharge Disposition:  [place]  Per UR Regulation:    If discussed at Long Length of Stay Meetings, dates discussed:    Comments:  12/27/13 Claretha Cooper RN BSN CM

## 2013-12-27 NOTE — Discharge Summary (Signed)
Physician Discharge Summary  Patient ID: Brianna Parker MRN: [id number] DOB/AGE: May 11, 1922 80 y.o. Primary Care Physician:Keyvon Herter L, MD Admit date: 12/23/2013 Discharge date: 12/27/2013    Discharge Diagnoses:   Active Problems:   CHF, acute on chronic   Acute on chronic diastolic congestive heart failure   Acute respiratory failure with hypoxia   Chronic atrial fibrillation   Unspecified essential hypertension  failure to thrive Metabolic encephalopathy    Medication List    ASK your doctor about these medications       clorazepate 3.75 MG tablet  Commonly known as:  TRANXENE  Take 1 tablet by mouth 3 (three) times daily.     DIGOX 0.125 MG tablet  Generic drug:  digoxin  Take 1 tablet by mouth daily.     diltiazem 240 MG 24 hr capsule  Commonly known as:  CARDIZEM CD  Take 1 capsule by mouth daily.     diphenoxylate-atropine 2.5-0.025 MG per tablet  Commonly known as:  LOMOTIL  Take 1 tablet by mouth 4 (four) times daily as needed for diarrhea or loose stools.     ELIQUIS 5 MG Tabs tablet  Generic drug:  apixaban  Take 5 mg by mouth 2 (two) times daily.     ipratropium 0.06 % nasal spray  Commonly known as:  ATROVENT  Place 1-2 sprays into both nostrils 4 (four) times daily as needed (runny nose.).        Discharged Condition: Terminal    Consults: None  Significant Diagnostic Studies: Ct Head Wo Contrast  12/23/2013   CLINICAL DATA:  Garbled speech.  Legally blind.  EXAM: CT HEAD WITHOUT CONTRAST  TECHNIQUE: Contiguous axial images were obtained from the base of the skull through the vertex without contrast.  COMPARISON:  01/23/2010.  FINDINGS: No evidence for acute infarction, hemorrhage, mass lesion, hydrocephalus, or extra-axial fluid. Moderate cerebral and cerebellar atrophy. Hypoattenuation white matter consistent with small vessel disease. Calvarium intact. Hyperostosis. No sinus or mastoid disease. Dense left lenticular opacity. Right  cataract extraction.  IMPRESSION: Atrophy and small vessel disease. No acute intracranial findings. No acute cortical infarct is evident.   Electronically Signed   By: Rolla Flatten M.D.   On: 12/23/2013 18:19   Dg Chest Portable 1 View  12/23/2013   CLINICAL DATA:  WEAKNESS WEAKNESS  EXAM: PORTABLE CHEST - 1 VIEW  FINDINGS: Diffuse increased density within the lung bases. There is prominence of interstitial markings, right greater than left. Bilateral pleural effusions right greater than left. The bones are osteopenic. No acute osseous abnormalities. The cardiac silhouette is obscured. Aorta is tortuous and ectatic. Atherosclerotic calcifications are appreciated within the arch the aorta.  IMPRESSION: Interstitial prominence likely reflecting a component pulmonary edema.  Atelectasis versus infiltrate within the lung bases right greater than left.  Bilateral effusions moderate on the right small to moderate on the left.   Electronically Signed   By: Margaree Mackintosh M.D.   On: 12/23/2013 13:21    Lab Results: Basic Metabolic Panel:  Recent Labs  12/25/13 0605 12/26/13 0543  NA 142 145  K 3.3* 2.9*  CL 92* 84*  CO2 >45* >45*  GLUCOSE 114* 131*  BUN 17 14  CREATININE 0.61 0.55  CALCIUM 8.6 9.0   Liver Function Tests: No results found for this basename: AST, ALT, ALKPHOS, BILITOT, PROT, ALBUMIN,  in the last 72 hours   CBC: No results found for this basename: WBC, NEUTROABS, HGB, HCT, MCV, PLT,  in the last 72  hours  Recent Results (from the past 240 hour(s))  URINE CULTURE     Status: None   Collection Time    12/23/13  1:10 PM      Result Value Ref Range Status   Specimen Description URINE, CATHETERIZED   Final   Special Requests NONE   Final   Culture  Setup Time     Final   Value: 12/24/2013 02:40     Performed at [facility]     Final   Value: NO GROWTH     Performed at [facility]   Culture     Final   Value: NO GROWTH     Performed at  [facility]   Report Status 12/25/2013 FINAL   Final     Hospital Course: She was admitted with acute congestive heart failure. She became very agitated. She did fairly well for the first 24 hours and she became much less alert unable to swallow. Comfort care was initiated. Discussion was  Undertaken with family and she has no CODE BLUE status and plans are for her to be transferred to inpatient hospice facility  Discharge Exam: Blood pressure 118/83, pulse 85, temperature 97.7 F (36.5 C), temperature source Axillary, resp. rate 20, height 5' (1.524 m), weight 36.968 kg (81 lb 8 oz), SpO2 100.00%. She is in atrial fibrillation. Her chest is relatively clear. She is unresponsive  Disposition: To inpatient hospice facility      Signed: Alonza Bogus   12/27/2013, 9:14 AM

## 2013-12-27 NOTE — Progress Notes (Signed)
Nutrition Brief Note  Chart reviewed. Pt now transitioning to comfort care.  No further nutrition interventions warranted at this time.  Please re-consult as needed.   Calise Dunckel A. Taariq Leitz, RD, LDN Pager: [phone]  

## 2013-12-27 NOTE — Clinical Social Work Psychosocial (Signed)
    Clinical Social Work Department BRIEF PSYCHOSOCIAL ASSESSMENT 12/27/2013  Patient:  Brianna Parker, Brianna Parker     Account Number:  [id number]     Admit date:  12/23/2013  Clinical Social Worker:  Edwyna Shell, CLINICAL SOCIAL WORKER  Date/Time:  12/27/2013 10:00 AM  Referred by:  Physician  Date Referred:  12/27/2013 Referred for  Residential hospice placement   Other Referral:   Interview type:  Family Other interview type:   Family in room, patient has not spoken since yesterday per family.    PSYCHOSOCIAL DATA Living Status:  ALONE Admitted from facility:   Level of care:   Primary support name:  Jerene Canny, Matt Holmes Primary support relationship to patient:  CHILD, ADULT Degree of support available:   Lives alone, family visits daily.  Supportive and large family in room    CURRENT CONCERNS Current Concerns  Post-Acute Placement   Other Concerns:    SOCIAL WORK ASSESSMENT / PLAN CSW unable to assess patient directly, has not spoken since yesterday per family in room.  Patient lives alone but has family visiting frequently.  Was previously somewhat independent, although family checked on patient.  Has large, supportive and involved family.  CSW spoke w family re referral for residential hospice, family states they cannot care for her at home and request residential placement.  Aware that patient would want comfort care at end of life.  All children appear to be in agreement regarding hospice placement.  Family very attentive to patient, patient attempted to speak when her sister came to the bedside, family excited as she had been nonverbal for 24 hours.  CSW contacted hospice, hospice staff will be coming to assess patient early afternoon and have bed at hospice home for her today.   Assessment/plan status:  Psychosocial Support/Ongoing Assessment of Needs Other assessment/ plan:   Information/referral to community resources:   Hospice Home    PATIENT'S/FAMILY'S  RESPONSE TO PLAN OF CARE: Family supportive of patient's emotional needs, attentive and involved.  Willing to have her placed at hospice home, familiar w facility.        Edwyna Shell, LCSW Clinical Social Worker [phone])

## 2013-12-27 NOTE — Progress Notes (Signed)
Patient with orders to be discharge to Hospice home. Report called to Santiago Glad, Therapist, sports. Packet sent with patient. Patient transported via EMS.

## 2013-12-27 NOTE — Clinical Social Work Note (Signed)
Patient ready for transfer to [facility] in [place], will transfer via [facility] [place] approx 3:30 today.  Family informed and agreeable. Hospice ready to accept patient.  Transfer packet left w shadow chart, RN informed.  CSW signing off as no further SW needs identified.  Edwyna Shell, LCSW Clinical Social Worker [phone])

## 2013-12-27 NOTE — Progress Notes (Signed)
She remains nonverbal. She's not able to swallow. She does not appear to be in any acute distress. I discussed this with family at bedside and we are considering transfer to hospice inpatient facility for terminal care

## 2014-01-06 DEATH — deceased
